# Patient Record
Sex: Male | Born: 1975 | Race: Black or African American | Hispanic: No | Marital: Married | Smoking: Never smoker
Health system: Southern US, Community
[De-identification: ages and names within clinical notes are randomized; demographics above are authoritative.]

## PROBLEM LIST (undated history)

## (undated) DIAGNOSIS — K219 Gastro-esophageal reflux disease without esophagitis: Secondary | ICD-10-CM

## (undated) DIAGNOSIS — D649 Anemia, unspecified: Secondary | ICD-10-CM

## (undated) DIAGNOSIS — E119 Type 2 diabetes mellitus without complications: Secondary | ICD-10-CM

## (undated) DIAGNOSIS — E785 Hyperlipidemia, unspecified: Secondary | ICD-10-CM

## (undated) HISTORY — DX: Anemia, unspecified: D64.9

## (undated) HISTORY — PX: VASECTOMY: SHX75

## (undated) HISTORY — PX: COLONOSCOPY: SHX174

## (undated) HISTORY — DX: Type 2 diabetes mellitus without complications: E11.9

## (undated) HISTORY — DX: Gastro-esophageal reflux disease without esophagitis: K21.9

## (undated) HISTORY — DX: Hyperlipidemia, unspecified: E78.5

---

## 1999-07-31 ENCOUNTER — Emergency Department (HOSPITAL_COMMUNITY): Admission: EM | Admit: 1999-07-31 | Discharge: 1999-07-31 | Payer: Self-pay | Admitting: Emergency Medicine

## 2003-09-14 ENCOUNTER — Emergency Department (HOSPITAL_COMMUNITY): Admission: EM | Admit: 2003-09-14 | Discharge: 2003-09-14 | Payer: Self-pay | Admitting: Emergency Medicine

## 2003-09-14 ENCOUNTER — Other Ambulatory Visit: Payer: Self-pay

## 2006-05-11 ENCOUNTER — Emergency Department (HOSPITAL_COMMUNITY): Admission: EM | Admit: 2006-05-11 | Discharge: 2006-05-11 | Payer: Self-pay | Admitting: Emergency Medicine

## 2006-08-21 ENCOUNTER — Ambulatory Visit (HOSPITAL_COMMUNITY): Admission: RE | Admit: 2006-08-21 | Discharge: 2006-08-21 | Payer: Self-pay | Admitting: Internal Medicine

## 2008-07-12 ENCOUNTER — Ambulatory Visit: Payer: Self-pay | Admitting: Diagnostic Radiology

## 2008-07-12 ENCOUNTER — Ambulatory Visit (HOSPITAL_BASED_OUTPATIENT_CLINIC_OR_DEPARTMENT_OTHER): Admission: RE | Admit: 2008-07-12 | Discharge: 2008-07-12 | Payer: Self-pay | Admitting: Internal Medicine

## 2008-07-21 ENCOUNTER — Encounter: Admission: RE | Admit: 2008-07-21 | Discharge: 2008-10-02 | Payer: Self-pay | Admitting: Internal Medicine

## 2008-11-23 ENCOUNTER — Emergency Department (HOSPITAL_BASED_OUTPATIENT_CLINIC_OR_DEPARTMENT_OTHER): Admission: EM | Admit: 2008-11-23 | Discharge: 2008-11-23 | Payer: Self-pay | Admitting: Emergency Medicine

## 2010-05-10 ENCOUNTER — Emergency Department (INDEPENDENT_AMBULATORY_CARE_PROVIDER_SITE_OTHER)
Admission: EM | Admit: 2010-05-10 | Discharge: 2010-05-11 | Payer: Self-pay | Source: Home / Self Care | Admitting: Emergency Medicine

## 2010-05-10 LAB — POCT CARDIAC MARKERS
CKMB, poc: <1 ng/mL — ABNORMAL LOW (ref 1.0–8.0)
Troponin i, poc: <0.05 ng/mL (ref 0.00–0.09)

## 2010-05-10 LAB — DIFFERENTIAL
Basophils Relative: 1 % (ref 0–1)
Eosinophils Relative: 5 % (ref 0–5)
Lymphocytes Relative: 50 % — ABNORMAL HIGH (ref 12–46)
Monocytes Absolute: 0.6 10*3/uL (ref 0.1–1.0)
Monocytes Relative: 10 % (ref 3–12)
Neutro Abs: 1.9 10*3/uL (ref 1.7–7.7)

## 2010-05-10 LAB — CBC
MCHC: 33.6 g/dL (ref 30.0–36.0)
MCV: 67.8 fL — ABNORMAL LOW (ref 78.0–100.0)
Platelets: 196 10*3/uL (ref 150–400)
RBC: 5.66 MIL/uL (ref 4.22–5.81)

## 2010-05-11 DIAGNOSIS — R079 Chest pain, unspecified: Secondary | ICD-10-CM

## 2010-05-11 DIAGNOSIS — R0602 Shortness of breath: Secondary | ICD-10-CM

## 2010-05-11 LAB — POCT CARDIAC MARKERS
CKMB, poc: <1 ng/mL — ABNORMAL LOW (ref 1.0–8.0)
Myoglobin, poc: 29.2 ng/mL (ref 12–200)
Troponin i, poc: <0.05 ng/mL (ref 0.00–0.09)

## 2010-05-11 LAB — BASIC METABOLIC PANEL
CO2: 27 mEq/L (ref 19–32)
Calcium: 9.4 mg/dL (ref 8.4–10.5)
Chloride: 106 mEq/L (ref 96–112)
GFR calc Af Amer: >60 mL/min (ref >60)

## 2010-05-11 LAB — D-DIMER, QUANTITATIVE: D-Dimer, Quant: <0.22 ug/mL-FEU (ref 0.00–0.48)

## 2010-07-20 LAB — COMPREHENSIVE METABOLIC PANEL
AST: 28 U/L (ref 0–37)
Alkaline Phosphatase: 46 U/L (ref 39–117)
BUN: 17 mg/dL (ref 6–23)
CO2: 28 mEq/L (ref 19–32)
Calcium: 9.2 mg/dL (ref 8.4–10.5)
Creatinine, Ser: 1.1 mg/dL (ref 0.4–1.5)
Glucose, Bld: 91 mg/dL (ref 70–99)
Total Protein: 7.7 g/dL (ref 6.0–8.3)

## 2010-07-20 LAB — DIFFERENTIAL
Basophils Absolute: 0.2 10*3/uL — ABNORMAL HIGH (ref 0.0–0.1)
Monocytes Absolute: 0.7 10*3/uL (ref 0.1–1.0)
Monocytes Relative: 9 % (ref 3–12)
Neutro Abs: 5.9 10*3/uL (ref 1.7–7.7)

## 2010-07-20 LAB — CBC
HCT: 39.8 % (ref 39.0–52.0)
Hemoglobin: 13.1 g/dL (ref 13.0–17.0)
MCHC: 32.8 g/dL (ref 30.0–36.0)
MCV: 74.8 fL — ABNORMAL LOW (ref 78.0–100.0)
RBC: 5.33 MIL/uL (ref 4.22–5.81)
RDW: 13.3 % (ref 11.5–15.5)
WBC: 8.1 10*3/uL (ref 4.0–10.5)

## 2010-07-20 LAB — URINALYSIS, ROUTINE W REFLEX MICROSCOPIC
Bilirubin Urine: NEGATIVE
Specific Gravity, Urine: 1.004 — ABNORMAL LOW (ref 1.005–1.030)
Urobilinogen, UA: 0.2 mg/dL (ref 0.0–1.0)

## 2010-07-20 LAB — URINE MICROSCOPIC-ADD ON

## 2010-07-20 LAB — URINE CULTURE

## 2011-10-11 ENCOUNTER — Encounter (HOSPITAL_BASED_OUTPATIENT_CLINIC_OR_DEPARTMENT_OTHER): Payer: Self-pay | Admitting: *Deleted

## 2011-10-11 ENCOUNTER — Emergency Department (HOSPITAL_BASED_OUTPATIENT_CLINIC_OR_DEPARTMENT_OTHER)
Admission: EM | Admit: 2011-10-11 | Discharge: 2011-10-12 | Disposition: A | Discharge status: Home / Self Care | Payer: Self-pay | Attending: Emergency Medicine | Admitting: Emergency Medicine

## 2011-10-11 DIAGNOSIS — K6289 Other specified diseases of anus and rectum: Secondary | ICD-10-CM | POA: Insufficient documentation

## 2011-10-11 DIAGNOSIS — K649 Unspecified hemorrhoids: Secondary | ICD-10-CM | POA: Insufficient documentation

## 2011-10-11 MED ORDER — HYDROCORTISONE 2.5 % RE CREA: TOPICAL_CREAM | RECTAL | Status: AC

## 2011-10-11 NOTE — ED Notes (Signed)
Pt states he has had some problems with rectal pain and bleeding. Wife "checked" and states he has "something hanging out". Denies itching.

## 2011-10-11 NOTE — ED Provider Notes (Signed)
History/physical exam/procedure(s) were performed by non-physician practitioner and as supervising physician I was immediately available for consultation/collaboration. I have reviewed all notes and am in agreement with care and plan.   Zeva Leber S Quinten Allerton, MD 10/11/11 2348 

## 2011-10-11 NOTE — Discharge Instructions (Signed)
High Fiber Diet A high fiber diet changes your normal diet to include more whole grains, legumes, fruits, and vegetables. Changes in the diet involve replacing refined carbohydrates with unrefined foods. The calorie level of the diet is essentially unchanged. The Dietary Reference Intake (recommended amount) for adult males is 38 g per day. For adult females, it is 25 g per day. Pregnant and lactating women should consume 28 g of fiber per day. Fiber is the intact part of a plant that is not broken down during digestion. Functional fiber is fiber that has been isolated from the plant to provide a beneficial effect in the body. PURPOSE  Increase stool bulk.   Ease and regulate bowel movements.   Lower cholesterol.  INDICATIONS THAT YOU NEED MORE FIBER  Constipation and hemorrhoids.   Uncomplicated diverticulosis (intestine condition) and irritable bowel syndrome.   Weight management.   As a protective measure against hardening of the arteries (atherosclerosis), diabetes, and cancer.  NOTE OF CAUTION If you have a digestive or bowel problem, ask your caregiver for advice before adding high fiber foods to your diet. Some of the following medical problems are such that a high fiber diet should not be used without consulting your caregiver:  Acute diverticulitis (intestine infection).   Partial small bowel obstructions.   Complicated diverticular disease involving bleeding, rupture (perforation), or abscess (boil, furuncle).   Presence of autonomic neuropathy (nerve damage) or gastric paresis (stomach cannot empty itself).  GUIDELINES FOR INCREASING FIBER  Start adding fiber to the diet slowly. A gradual increase of about 5 more grams (2 slices of whole-wheat bread, 2 servings of most fruits or vegetables, or 1 bowl of high fiber cereal) per day is best. Too rapid an increase in fiber may result in constipation, flatulence, and bloating.   Drink enough water and fluids to keep your urine  clear or pale yellow. Water, juice, or caffeine-free drinks are recommended. Not drinking enough fluid may cause constipation.   Eat a variety of high fiber foods rather than one type of fiber.   Try to increase your intake of fiber through using high fiber foods rather than fiber pills or supplements that contain small amounts of fiber.   The goal is to change the types of food eaten. Do not supplement your present diet with high fiber foods, but replace foods in your present diet.  INCLUDE A VARIETY OF FIBER SOURCES  Replace refined and processed grains with whole grains, canned fruits with fresh fruits, and incorporate other fiber sources. White rice, white breads, and most bakery goods contain little or no fiber.   Brown whole-grain rice, buckwheat oats, and many fruits and vegetables are all good sources of fiber. These include: broccoli, Brussels sprouts, cabbage, cauliflower, beets, sweet potatoes, white potatoes (skin on), carrots, tomatoes, eggplant, squash, berries, fresh fruits, and dried fruits.   Cereals appear to be the richest source of fiber. Cereal fiber is found in whole grains and bran. Bran is the fiber-rich outer coat of cereal grain, which is largely removed in refining. In whole-grain cereals, the bran remains. In breakfast cereals, the largest amount of fiber is found in those with "bran" in their names. The fiber content is sometimes indicated on the label.   You may need to include additional fruits and vegetables each day.   In baking, for 1 cup white flour, you may use the following substitutions:   1 cup whole-wheat flour minus 2 tbs.    cup white flour plus    cup whole-wheat flour.  Document Released: 03/31/2005 Document Revised: 03/20/2011 Document Reviewed: 02/06/2009 Exit[employer] Patient Information 2012 [employer].   Hemorrhoids Hemorrhoids are enlarged (dilated) veins around the rectum. There are 2 types of hemorrhoids, and the type of hemorrhoid is  determined by its location. Internal hemorrhoids occur in the veins just inside the rectum.They are usually not painful, but they may bleed.However, they may poke through to the outside and become irritated and painful. External hemorrhoids involve the veins outside the anus and can be felt as a painful swelling or hard lump near the anus.They are often itchy and may crack and bleed. Sometimes clots will form in the veins. This makes them swollen and painful. These are called thrombosed hemorrhoids. CAUSES Causes of hemorrhoids include:  Pregnancy. This increases the pressure in the hemorrhoidal veins.   Constipation.   Straining to have a bowel movement.   Obesity.   Heavy lifting or other activity that caused you to strain.  TREATMENT Most of the time hemorrhoids improve in 1 to 2 weeks. However, if symptoms do not seem to be getting better or if you have a lot of rectal bleeding, your caregiver may perform a procedure to help make the hemorrhoids get smaller or remove them completely.Possible treatments include:  Rubber band ligation. A rubber band is placed at the base of the hemorrhoid to cut off the circulation.   Sclerotherapy. A chemical is injected to shrink the hemorrhoid.   Infrared light therapy. Tools are used to burn the hemorrhoid.   Hemorrhoidectomy. This is surgical removal of the hemorrhoid.  HOME CARE INSTRUCTIONS   Increase fiber in your diet. Ask your caregiver about using fiber supplements.   Drink enough water and fluids to keep your urine clear or pale yellow.   Exercise regularly.   Go to the bathroom when you have the urge to have a bowel movement. Do not wait.   Avoid straining to have bowel movements.   Keep the anal area dry and clean.   Only take over-the-counter or prescription medicines for pain, discomfort, or fever as directed by your caregiver.  If your hemorrhoids are thrombosed:  Take warm sitz baths for 20 to 30 minutes, 3 to 4 times  per day.   If the hemorrhoids are very tender and swollen, place ice packs on the area as tolerated. Using ice packs between sitz baths may be helpful. Fill a plastic bag with ice. Place a towel between the bag of ice and your skin.   Medicated creams and suppositories may be used or applied as directed.   Do not use a donut-shaped pillow or sit on the toilet for long periods. This increases blood pooling and pain.  SEEK MEDICAL CARE IF:   You have increasing pain and swelling that is not controlled with your medicine.   You have uncontrolled bleeding.   You have difficulty or you are unable to have a bowel movement.   You have pain or inflammation outside the area of the hemorrhoids.   You have chills or an oral temperature above 102 F (38.9 C).  MAKE SURE YOU:   Understand these instructions.   Will watch your condition.   Will get help right away if you are not doing well or get worse.  Document Released: 03/28/2000 Document Revised: 03/20/2011 Document Reviewed: 08/03/2007 ExitCare Patient Information 2012 [employer].  

## 2011-10-11 NOTE — ED Provider Notes (Signed)
History     CSN: [id number]  Arrival date & time 10/11/11  2149   First MD Initiated Contact with Patient 10/11/11 2246      Chief Complaint  Patient presents with  . Rectal Pain    (Consider location/radiation/quality/duration/timing/severity/associated sxs/prior treatment) HPI Comments: Pt states that he has had rectal pain and bleeding with wiping over the last couple of days and his wife noted this evening that he had something sticking out:pt states that he has history of constipation  The history is provided by the patient. No language interpreter was used.    History reviewed. No pertinent past medical history.  History reviewed. No pertinent past surgical history.  History reviewed. No pertinent family history.  History  Substance Use Topics  . Smoking status: Never Smoker   . Smokeless tobacco: Not on file  . Alcohol Use: No      Review of Systems  Constitutional: Negative.   Respiratory: Negative.   Cardiovascular: Negative.   Neurological: Negative.     Allergies  Review of patient's allergies indicates no known allergies.  Home Medications  No current outpatient prescriptions on file.  BP 131/75  Pulse 103  Temp 98.7 F (37.1 C) (Oral)  Resp 20  Ht 6' (1.829 m)  Wt 219 lb (99.338 kg)  BMI 29.70 kg/m2  SpO2 100%  Physical Exam  Vitals reviewed. Constitutional: He is oriented to person, place, and time. He appears well-developed and well-nourished.  Cardiovascular: Normal rate and regular rhythm.   Pulmonary/Chest: Effort normal.  Genitourinary:       External hemorrhoids noted  Neurological: He is alert and oriented to person, place, and time.  Skin: Skin is warm and dry.    ED Course  Procedures (including critical care time)  Labs Reviewed - No data to display No results found.   1. Hemorrhoid       MDM  Educated pt on high fiber foods        Teressa Lower, NP 10/11/11 2340

## 2012-12-03 ENCOUNTER — Encounter (HOSPITAL_BASED_OUTPATIENT_CLINIC_OR_DEPARTMENT_OTHER): Payer: Self-pay

## 2012-12-03 ENCOUNTER — Emergency Department (HOSPITAL_BASED_OUTPATIENT_CLINIC_OR_DEPARTMENT_OTHER)
Admission: EM | Admit: 2012-12-03 | Discharge: 2012-12-03 | Disposition: A | Discharge status: Home / Self Care | Payer: BC Managed Care – PPO | Attending: Emergency Medicine | Admitting: Emergency Medicine

## 2012-12-03 DIAGNOSIS — K625 Hemorrhage of anus and rectum: Secondary | ICD-10-CM | POA: Insufficient documentation

## 2012-12-03 DIAGNOSIS — R142 Eructation: Secondary | ICD-10-CM | POA: Insufficient documentation

## 2012-12-03 DIAGNOSIS — R5381 Other malaise: Secondary | ICD-10-CM | POA: Insufficient documentation

## 2012-12-03 DIAGNOSIS — R209 Unspecified disturbances of skin sensation: Secondary | ICD-10-CM | POA: Insufficient documentation

## 2012-12-03 DIAGNOSIS — D649 Anemia, unspecified: Secondary | ICD-10-CM | POA: Insufficient documentation

## 2012-12-03 DIAGNOSIS — R141 Gas pain: Secondary | ICD-10-CM | POA: Insufficient documentation

## 2012-12-03 DIAGNOSIS — K5900 Constipation, unspecified: Secondary | ICD-10-CM | POA: Insufficient documentation

## 2012-12-03 DIAGNOSIS — K922 Gastrointestinal hemorrhage, unspecified: Secondary | ICD-10-CM

## 2012-12-03 DIAGNOSIS — K921 Melena: Secondary | ICD-10-CM | POA: Insufficient documentation

## 2012-12-03 LAB — CBC WITH DIFFERENTIAL/PLATELET
Basophils Absolute: 0 10*3/uL (ref 0.0–0.1)
Basophils Relative: 0 % (ref 0–1)
Eosinophils Absolute: 0.2 10*3/uL (ref 0.0–0.7)
Eosinophils Relative: 3 % (ref 0–5)
HCT: 35.8 % — ABNORMAL LOW (ref 39.0–52.0)
Hemoglobin: 11.5 g/dL — ABNORMAL LOW (ref 13.0–17.0)
Lymphs Abs: 2.3 10*3/uL (ref 0.7–4.0)
MCV: 66.5 fL — ABNORMAL LOW (ref 78.0–100.0)
Monocytes Absolute: 0.7 10*3/uL (ref 0.1–1.0)
Neutro Abs: 1.8 10*3/uL (ref 1.7–7.7)
Neutrophils Relative %: 36 % — ABNORMAL LOW (ref 43–77)
Platelets: 217 10*3/uL (ref 150–400)
WBC: 5 10*3/uL (ref 4.0–10.5)

## 2012-12-03 LAB — COMPREHENSIVE METABOLIC PANEL
AST: 17 U/L (ref 0–37)
Alkaline Phosphatase: 45 U/L (ref 39–117)
BUN: 12 mg/dL (ref 6–23)
CO2: 26 mEq/L (ref 19–32)
Chloride: 103 mEq/L (ref 96–112)
GFR calc Af Amer: >90 mL/min (ref >90)
Sodium: 140 mEq/L (ref 135–145)
Total Protein: 7.7 g/dL (ref 6.0–8.3)

## 2012-12-03 LAB — OCCULT BLOOD X 1 CARD TO LAB, STOOL: Fecal Occult Bld: POSITIVE — AB

## 2012-12-03 NOTE — ED Provider Notes (Signed)
CSN: [id number]     Arrival date & time 12/03/12  1429 History     First MD Initiated Contact with Patient 12/03/12 1500     Chief Complaint  Patient presents with  . Dizziness    HPI  Patient presents with concern of dizziness and bilateral dysesthesia in all extremities.  These developments are new.  Patient has a longer history of fatigue, intermittent GI bleeding.  In the past weeks he has been evaluated by his primary care physician and the GI doctor to 2 abnormal iron function testing and new anemia. He states over the past 2/3 days he has had the aforementioned new complaints as well as persistent fatigue. He denies a specific pain anywhere, states that he feels generally unwell. He now complains of constipation as well as intermittent bloody stool. He is scheduled for endoscopy in 2 weeks. Patient is otherwise generally well, denied other medical problems, smoking, alcohol use. His family history of diabetes mellitus  History reviewed. No pertinent past medical history. History reviewed. No pertinent past surgical history. No family history on file. History  Substance Use Topics  . Smoking status: Never Smoker   . Smokeless tobacco: Not on file  . Alcohol Use: No    Review of Systems  Constitutional:       Per HPI, otherwise negative  HENT:       Per HPI, otherwise negative  Respiratory:       Per HPI, otherwise negative  Cardiovascular:       Per HPI, otherwise negative  Gastrointestinal: Positive for constipation, blood in stool, abdominal distention and anal bleeding. Negative for vomiting, abdominal pain, diarrhea and rectal pain.  Endocrine:       Negative aside from HPI  Genitourinary:       Neg aside from HPI   Musculoskeletal:       Per HPI, otherwise negative  Skin: Negative.   Neurological: Positive for dizziness, weakness, light-headedness and numbness. Negative for tremors, seizures, syncope, facial asymmetry, speech difficulty and headaches.     Allergies  Review of patient's allergies indicates no known allergies.  Home Medications  No current outpatient prescriptions on file. BP 131/86  Pulse 73  Temp(Src) 98.7 F (37.1 C) (Oral)  Resp 16  Ht 6\' 2"  (1.88 m)  Wt 227 lb (102.967 kg)  BMI 29.13 kg/m2  SpO2 100% Physical Exam  Nursing note and vitals reviewed. Constitutional: He is oriented to person, place, and time. He appears well-developed. No distress.  HENT:  Head: Normocephalic and atraumatic.  Eyes: Conjunctivae and EOM are normal.  Cardiovascular: Normal rate and regular rhythm.   Pulmonary/Chest: Effort normal. No stridor. No respiratory distress.  Abdominal: He exhibits no distension.  Genitourinary: Rectal exam shows external hemorrhoid. Rectal exam shows no internal hemorrhoid, no fissure, no mass, no tenderness and anal tone normal. Guaiac positive stool.  No bleeding external hemorrhoids, no fissure, stool is grossly brown  Musculoskeletal: He exhibits no edema.  Neurological: He is alert and oriented to person, place, and time.  Skin: Skin is warm and dry.  Psychiatric: He has a normal mood and affect.    ED Course   Procedures (including critical care time)  Labs Reviewed  CBC WITH DIFFERENTIAL  COMPREHENSIVE METABOLIC PANEL  OCCULT BLOOD X 1 CARD TO LAB, STOOL   No results found. No diagnosis found.   4:54 PM Patient in no distress.  Discussed all findings.  We discussed the need for prompt outpatient followup. MDM  This  patient presents with concern for dysesthesia, dizziness.  Patient is afebrile, not tachycardic, tachypneic, in no distress.  Patient's stool Hemoccult positive, but grossly negative.  His hemoglobin is decreased from prior, but not critically low.  Absent distress, and with the relatively reassuring findings, the patient was discharged in stable condition to follow up with his GI team promptly.  Gerhard Munch, MD 12/03/12 1655

## 2012-12-03 NOTE — ED Notes (Signed)
Pt c/o dizzines and numbness to hands and feer x 2 weeks-was seen by PCP last week-advised iron level was low-was seen by GI this week-iron level normal but ferritin level low-is scheduled for GI procedure 9/12 "to see where i'm bleeding"-reports dark stools x approx 2 months

## 2012-12-23 ENCOUNTER — Emergency Department (HOSPITAL_BASED_OUTPATIENT_CLINIC_OR_DEPARTMENT_OTHER)
Admission: EM | Admit: 2012-12-23 | Discharge: 2012-12-23 | Disposition: A | Discharge status: Home / Self Care | Payer: BC Managed Care – PPO | Attending: Emergency Medicine | Admitting: Emergency Medicine

## 2012-12-23 ENCOUNTER — Emergency Department (HOSPITAL_BASED_OUTPATIENT_CLINIC_OR_DEPARTMENT_OTHER): Payer: BC Managed Care – PPO

## 2012-12-23 ENCOUNTER — Encounter (HOSPITAL_BASED_OUTPATIENT_CLINIC_OR_DEPARTMENT_OTHER): Payer: Self-pay | Admitting: *Deleted

## 2012-12-23 DIAGNOSIS — R109 Unspecified abdominal pain: Secondary | ICD-10-CM

## 2012-12-23 DIAGNOSIS — R1031 Right lower quadrant pain: Secondary | ICD-10-CM | POA: Insufficient documentation

## 2012-12-23 DIAGNOSIS — K5900 Constipation, unspecified: Secondary | ICD-10-CM | POA: Insufficient documentation

## 2012-12-23 DIAGNOSIS — R110 Nausea: Secondary | ICD-10-CM | POA: Insufficient documentation

## 2012-12-23 DIAGNOSIS — Z79899 Other long term (current) drug therapy: Secondary | ICD-10-CM | POA: Insufficient documentation

## 2012-12-23 LAB — CBC WITH DIFFERENTIAL/PLATELET
Basophils Absolute: 0 10*3/uL (ref 0.0–0.1)
Eosinophils Absolute: 0.1 10*3/uL (ref 0.0–0.7)
HCT: 36.7 % — ABNORMAL LOW (ref 39.0–52.0)
Lymphocytes Relative: 18 % (ref 12–46)
MCHC: 32.7 g/dL (ref 30.0–36.0)
Monocytes Relative: 8 % (ref 3–12)
Neutrophils Relative %: 73 % (ref 43–77)
Platelets: 176 10*3/uL (ref 150–400)
RDW: 20.6 % — ABNORMAL HIGH (ref 11.5–15.5)
WBC: 8.4 10*3/uL (ref 4.0–10.5)

## 2012-12-23 LAB — COMPREHENSIVE METABOLIC PANEL
AST: 15 U/L (ref 0–37)
BUN: 7 mg/dL (ref 6–23)
CO2: 24 mEq/L (ref 19–32)
Chloride: 101 mEq/L (ref 96–112)
Creatinine, Ser: 1 mg/dL (ref 0.50–1.35)
GFR calc non Af Amer: >90 mL/min (ref >90)
Total Bilirubin: 0.9 mg/dL (ref 0.3–1.2)

## 2012-12-23 LAB — URINALYSIS, ROUTINE W REFLEX MICROSCOPIC
Glucose, UA: NEGATIVE mg/dL
Ketones, ur: NEGATIVE mg/dL
Leukocytes, UA: NEGATIVE
Nitrite: NEGATIVE
Protein, ur: NEGATIVE mg/dL
Urobilinogen, UA: 0.2 mg/dL (ref 0.0–1.0)

## 2012-12-23 MED ORDER — HYDROCODONE-ACETAMINOPHEN 5-325 MG PO TABS: 2.0000 | ORAL_TABLET | ORAL | Status: DC | PRN

## 2012-12-23 MED ORDER — IOHEXOL 300 MG/ML  SOLN
50.0000 mL | Freq: Once | INTRAMUSCULAR | Status: AC | PRN
  Administered 2012-12-23: 50 mL via ORAL

## 2012-12-23 MED ORDER — IOHEXOL 300 MG/ML  SOLN
100.0000 mL | Freq: Once | INTRAMUSCULAR | Status: AC | PRN
  Administered 2012-12-23: 100 mL via INTRAVENOUS

## 2012-12-23 NOTE — ED Provider Notes (Signed)
CSN: [id number]     Arrival date & time 12/23/12  1902 History   First MD Initiated Contact with Patient 12/23/12 1925     Chief Complaint  Patient presents with  . Abdominal Pain   (Consider location/radiation/quality/duration/timing/severity/associated sxs/prior Treatment) HPI Comments: Pt states that he has long term problem with constipation and rectal bleeding:pt states that he woke up this morning with diffuse abdominal so he used magnesium citrate and had a bowel movement:pt states that the pain has moved form the diffuse to rlq:pt states that he does have some chronic problems with pain ,but this pain is different then normal:nausea, no vomiting:denies rectal bleeding:states that he has had chills today  The history is provided by the patient. No language interpreter was used.    History reviewed. No pertinent past medical history. History reviewed. No pertinent past surgical history. No family history on file. History  Substance Use Topics  . Smoking status: Never Smoker   . Smokeless tobacco: Not on file  . Alcohol Use: No    Review of Systems  Constitutional: Negative.   Respiratory: Negative.   Cardiovascular: Negative.     Allergies  Review of patient's allergies indicates no known allergies.  Home Medications   Current Outpatient Rx  Name  Route  Sig  Dispense  Refill  . IRON PO   Oral   Take by mouth.         . metFORMIN (GLUCOPHAGE) 500 MG tablet   Oral   Take 500 mg by mouth 2 (two) times daily with a meal.          BP 114/82  Pulse 93  Temp(Src) 99.1 F (37.3 C) (Oral)  Resp 20  Ht 6\' 2"  (1.88 m)  Wt 220 lb (99.791 kg)  BMI 28.23 kg/m2  SpO2 98% Physical Exam  Nursing note and vitals reviewed. Constitutional: He is oriented to person, place, and time. He appears well-developed and well-nourished.  HENT:  Head: Normocephalic and atraumatic.  Eyes: Conjunctivae and EOM are normal.  Neck: Neck supple.  Cardiovascular: Normal rate and  regular rhythm.   Pulmonary/Chest: Effort normal and breath sounds normal.  Abdominal: Soft. Bowel sounds are normal. There is tenderness in the right lower quadrant.  Musculoskeletal: Normal range of motion.  Neurological: He is alert and oriented to person, place, and time.  Skin: Skin is warm and dry.    ED Course  Procedures (including critical care time) Labs Review Labs Reviewed  URINALYSIS, ROUTINE W REFLEX MICROSCOPIC - Abnormal; Notable for the following:    Specific Gravity, Urine 1.002 (*)    All other components within normal limits  CBC WITH DIFFERENTIAL - Abnormal; Notable for the following:    Hemoglobin 12.0 (*)    HCT 36.7 (*)    MCV 68.2 (*)    MCH 22.3 (*)    RDW 20.6 (*)    All other components within normal limits  COMPREHENSIVE METABOLIC PANEL - Abnormal; Notable for the following:    Glucose, Bld 106 (*)    All other components within normal limits   Imaging Review Ct Abdomen Pelvis W Contrast  12/23/2012   *RADIOLOGY REPORT*  Clinical Data: Abdominal pain  CT ABDOMEN AND PELVIS WITH CONTRAST  Technique:  Multidetector CT imaging of the abdomen and pelvis was performed following the standard protocol during bolus administration of intravenous contrast.  Contrast:  OMNIPAQUE IOHEXOL 300 MG/ML  SOLN  Comparison: 08/21/2006  Findings: The lung bases are free of acute infiltrate  or sizable effusion.  The liver, gallbladder, spleen, adrenal glands and pancreas are all normal in their CT appearance.  Kidneys are well visualized bilaterally demonstrating normal enhancement pattern.  The appendix is within normal limits without inflammatory change.  The bladder is partially distended with urine.  No pelvic mass lesion or side wall abnormality is noted. The bony structures are within normal limits.  IMPRESSION: No acute abnormalities seen.   Original Report Authenticated By: Alcide Clever, M.D.    MDM   1. Abdominal pain   no acute process noted on ct;will treat  symptomatically:pt to follow up with colonoscopy in 6 weeks    Teressa Lower, NP 12/23/12 2140

## 2012-12-23 NOTE — ED Provider Notes (Signed)
Medical screening examination/treatment/procedure(s) were performed by non-physician practitioner and as supervising physician I was immediately available for consultation/collaboration.   Carlena Ruybal, MD 12/23/12 2235 

## 2012-12-23 NOTE — ED Notes (Signed)
Abdominal pain. Has been constipated. Had a small bowel movement and pain went from the left lower quadrant to the right lower quadrant. Colonoscopy was scheduled for tomorrow but he did not have the money for the co pay. He states his iron level dropped from 50 to 6.

## 2013-03-15 ENCOUNTER — Encounter: Payer: Self-pay | Admitting: *Deleted

## 2013-03-15 ENCOUNTER — Encounter: Payer: BC Managed Care – PPO | Attending: Internal Medicine | Admitting: *Deleted

## 2013-03-15 VITALS — Ht 72.0 in | Wt 210.0 lb

## 2013-03-15 DIAGNOSIS — E119 Type 2 diabetes mellitus without complications: Secondary | ICD-10-CM

## 2013-03-15 DIAGNOSIS — E109 Type 1 diabetes mellitus without complications: Secondary | ICD-10-CM | POA: Insufficient documentation

## 2013-03-15 DIAGNOSIS — Z713 Dietary counseling and surveillance: Secondary | ICD-10-CM | POA: Insufficient documentation

## 2013-03-15 NOTE — Progress Notes (Signed)
Appt start time: 1200 end time:  1330.  Assessment:  Patient was seen on  03/15/13 for individual diabetes education. Patient has recently been advised that he has new onset T2DM. This appointment was advised by Dr. Greggory Stallion Osei-Bonsu. Patient last saw him 12/06/12. The patient has recently changed to an [facility] physician for 03/21/13. 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 Lives with wife and two children and a nephew. Wife does the shopping and cooking. Patient is from [place].  Mother has passed, father still lives in [place]. Initially had some neuropathy in hands. Was given Lyrica, took 3 doses and stopped. Symptoms resolved. Has had 18# weight loss since 11/2012. He has not been directed to test glucose.  Current HbA1c: 6.6%  Preferred Learning Style:   Hands on  Learning Readiness:   Change in progress  MEDICATIONS: Was initially RX Metformin. Took for 30 days and did not realize there was a refill. Is not presently taking medication for T2DM  DIETARY INTAKE:  B ( AM): egg, wheat bread x2, water  Snk ( AM): none  L ( PM): brown rice 1 1/2C  / or white rice 2/3C  / vegetables, baked fish or chicken , water Snk ( PM): none D ( PM): rice, vegetables Snk ( PM): none Beverages: water  At present he is doing a fasting & prayer from 6A-6P. Will then eat regular food after 6pm.  Usual physical activity: Goes to the gym 3X weekly for each visit.  Also coaches soccer.  Intervention:  Nutrition counseling provided.  Discussed diabetes disease process and treatment options.  Discussed physiology of diabetes and role of obesity on insulin resistance.  Encouraged moderate weight reduction to improve glucose levels.  Discussed role of medications and diet in glucose control  Provided education on macronutrients on glucose levels.  Provided education on carb counting, importance of regularly scheduled meals/snacks,  and meal planning  Discussed effects of physical activity on glucose levels and long-term glucose control.  Recommended 150 minutes of physical activity/week.  Reviewed patient medications.  Discussed role of medication on blood glucose and possible side effects  Discussed blood glucose monitoring and interpretation.  Discussed recommended target ranges and individual ranges.    Described short-term complications: hyper- and hypo-glycemia.  Discussed causes,symptoms, and treatment options.  Discussed prevention, detection, and treatment of long-term complications.  Discussed the role of prolonged elevated glucose levels on body systems.  Discussed role of stress on blood glucose levels and discussed strategies to manage psychosocial issues.  Discussed recommendations for long-term diabetes self-care.  Established checklist for medical, dental, and emotional self-care.  Teaching Method Utilized:  Visual Auditory Hands on  Handouts given during visit include: Living Well with Diabetes Carb Counting  Meal Plan Card Plate Method  Barriers to learning/adherence to lifestyle change: Culture r/t dietary intake  Diabetes self-care support plan:   [facility] support group  family  Demonstrated degree of understanding via:  Teach Back   Monitoring/Evaluation:  Dietary intake, exercise, and body weight follow up prn.

## 2014-06-27 ENCOUNTER — Emergency Department (HOSPITAL_BASED_OUTPATIENT_CLINIC_OR_DEPARTMENT_OTHER)
Admission: EM | Admit: 2014-06-27 | Discharge: 2014-06-28 | Disposition: A | Discharge status: Home / Self Care | Payer: BLUE CROSS/BLUE SHIELD | Attending: Emergency Medicine | Admitting: Emergency Medicine

## 2014-06-27 ENCOUNTER — Encounter (HOSPITAL_BASED_OUTPATIENT_CLINIC_OR_DEPARTMENT_OTHER): Payer: Self-pay | Admitting: *Deleted

## 2014-06-27 DIAGNOSIS — E119 Type 2 diabetes mellitus without complications: Secondary | ICD-10-CM | POA: Diagnosis not present

## 2014-06-27 DIAGNOSIS — K529 Noninfective gastroenteritis and colitis, unspecified: Secondary | ICD-10-CM | POA: Insufficient documentation

## 2014-06-27 DIAGNOSIS — Z79899 Other long term (current) drug therapy: Secondary | ICD-10-CM | POA: Diagnosis not present

## 2014-06-27 DIAGNOSIS — K648 Other hemorrhoids: Secondary | ICD-10-CM | POA: Diagnosis not present

## 2014-06-27 DIAGNOSIS — D649 Anemia, unspecified: Secondary | ICD-10-CM | POA: Diagnosis not present

## 2014-06-27 DIAGNOSIS — R1084 Generalized abdominal pain: Secondary | ICD-10-CM

## 2014-06-27 DIAGNOSIS — R109 Unspecified abdominal pain: Secondary | ICD-10-CM | POA: Diagnosis present

## 2014-06-27 LAB — URINE MICROSCOPIC-ADD ON

## 2014-06-27 LAB — URINALYSIS, ROUTINE W REFLEX MICROSCOPIC
GLUCOSE, UA: NEGATIVE mg/dL
HGB URINE DIPSTICK: NEGATIVE
Ketones, ur: 40 mg/dL — AB
Leukocytes, UA: NEGATIVE
Nitrite: NEGATIVE
PH: 5.5 (ref 5.0–8.0)
Protein, ur: 100 mg/dL — AB
Specific Gravity, Urine: 1.044 — ABNORMAL HIGH (ref 1.005–1.030)
Urobilinogen, UA: 0.2 mg/dL (ref 0.0–1.0)

## 2014-06-27 MED ORDER — SODIUM CHLORIDE 0.9 % IV SOLN
1000.0000 mL | Freq: Once | INTRAVENOUS | Status: AC
  Administered 2014-06-27: 1000 mL via INTRAVENOUS

## 2014-06-27 MED ORDER — ONDANSETRON HCL 4 MG/2ML IJ SOLN
4.0000 mg | Freq: Once | INTRAMUSCULAR | Status: AC
  Administered 2014-06-27: 4 mg via INTRAVENOUS
  Filled 2014-06-27: qty 2

## 2014-06-27 NOTE — ED Provider Notes (Signed)
CSN: [id number]     Arrival date & time 06/27/14  2157 History  This chart was scribed for Blane Ohara, MD by Gwenyth Ober, ED Scribe. This patient was seen in room MH01/MH01 and the patient's care was started at 11:37 PM.    Chief Complaint  Patient presents with  . Abdominal Pain   The history is provided by the patient. No language interpreter was used.    HPI Comments: Kurt Russell is a 39 y.o. male with a history of DM and hemorrhoids who presents to the Emergency Department complaining of constant, moderate abdominal pain that started 24 hours ago. He states mild dizziness, nausea, diarrhea, malodorous belching and increased flatulence as associated symptoms. Pt denies positive sick contact. He notes a history of similar symptoms, but states today's pain is worse than prior episodes. Pt denies recent travel, antibiotic treatment and EtOH/tobacco use. He also denies vomiting as an associated symptoms.  Past Medical History  Diagnosis Date  . Diabetes mellitus without complication   . Anemia   . Hyperlipidemia   . GERD (gastroesophageal reflux disease)    History reviewed. No pertinent past surgical history. No family history on file. History  Substance Use Topics  . Smoking status: Never Smoker   . Smokeless tobacco: Not on file  . Alcohol Use: No    Review of Systems  Gastrointestinal: Positive for nausea, abdominal pain and diarrhea. Negative for vomiting.  Neurological: Positive for dizziness.  All other systems reviewed and are negative.  Allergies  Review of patient's allergies indicates no known allergies.  Home Medications   Prior to Admission medications   Medication Sig Start Date End Date Taking? Authorizing Provider  HYDROcodone-acetaminophen (NORCO/VICODIN) 5-325 MG per tablet Take 2 tablets by mouth every 4 (four) hours as needed for pain. 12/23/12   Teressa Lower, NP  hydrocortisone (ANUSOL-HC) 2.5 % rectal cream Apply rectally 2 times daily 06/28/14    Blane Ohara, MD  IRON PO Take by mouth.    Historical Provider, MD  loperamide (IMODIUM) 2 MG capsule Take 1 capsule (2 mg total) by mouth 4 (four) times daily as needed for diarrhea or loose stools. 06/28/14   Blane Ohara, MD  metFORMIN (GLUCOPHAGE) 500 MG tablet Take 500 mg by mouth 2 (two) times daily with a meal.    Historical Provider, MD  ondansetron (ZOFRAN ODT) 4 MG disintegrating tablet  ODT q4 hours prn nausea/vomit 06/28/14   Blane Ohara, MD   BP 115/73 mmHg  Pulse 80  Temp(Src) 98.1 F (36.7 C)  Resp 16  Ht  (1.854 m)  Wt 210 lb (95.255 kg)  BMI 27.71 kg/m2  SpO2 100% Physical Exam  Constitutional: He appears well-developed and well-nourished. No distress.  HENT:  Head: Normocephalic and atraumatic.  Mildly dry mucous membranes  Eyes: Conjunctivae and EOM are normal. No scleral icterus.  Neck: Neck supple. No tracheal deviation present.  Cardiovascular: Normal rate.   Pulmonary/Chest: Effort normal. No respiratory distress.  Abdominal: There is tenderness.  Mild diffuse tenderness; no guarding; no peritonitis  Skin: Skin is warm and dry.  Psychiatric: He has a normal mood and affect. His behavior is normal.  Nursing note and vitals reviewed.   ED Course  Procedures   DIAGNOSTIC STUDIES: Oxygen Saturation is 99% on RA, normal by my interpretation.    COORDINATION OF CARE: 11:42 PM Discussed treatment plan with pt at bedside and pt agreed to plan.   Labs Review Labs Reviewed  URINALYSIS, ROUTINE W REFLEX MICROSCOPIC -  Abnormal; Notable for the following:    Color, Urine AMBER (*)    Specific Gravity, Urine 1.044 (*)    Bilirubin Urine SMALL (*)    Ketones, ur 40 (*)    Protein, ur 100 (*)    All other components within normal limits  CBC WITH DIFFERENTIAL/PLATELET - Abnormal; Notable for the following:    RBC 6.64 (*)    MCV 71.8 (*)    MCH 23.8 (*)    RDW 15.7 (*)    Neutrophils Relative % 82 (*)    Lymphocytes Relative 11 (*)    Lymphs  Abs 0.6 (*)    All other components within normal limits  COMPREHENSIVE METABOLIC PANEL - Abnormal; Notable for the following:    Glucose, Bld 117 (*)    Total Protein 9.2 (*)    Total Bilirubin 1.5 (*)    GFR calc non Af Amer 80 (*)    All other components within normal limits  URINE MICROSCOPIC-ADD ON  LIPASE, BLOOD    Imaging Review No results found.   EKG Interpretation None      MDM   Final diagnoses:  Abdominal pain, generalized  Enteritis  Other hemorrhoids    Results and differential diagnosis were discussed with the patient/parent/guardian. Close follow up outpatient was discussed, comfortable with the plan.   Patient improved in ER, medicines given for symptom treatment and known hemorrhoid treatment.  Medications  0.9 %  sodium chloride infusion (0 mLs Intravenous Stopped 06/28/14 0040)  ondansetron (ZOFRAN) injection 4 mg (4 mg Intravenous Given 06/27/14 2359)  loperamide (IMODIUM) capsule 4 mg (4 mg Oral Given 06/28/14 0133)  hydrocortisone (ANUSOL-HC) suppository 25 mg (25 mg Rectal Given 06/28/14 0228)  gi cocktail (Maalox,Lidocaine,Donnatal) (30 mLs Oral Given 06/28/14 0228)  ketorolac (TORADOL) 15 MG/ML injection 15 mg (15 mg Intravenous Given 06/28/14 0228)    Filed Vitals:   06/27/14 2213 06/28/14 0039  BP: 115/81 115/73  Pulse: 92 80  Temp: 98.1 F (36.7 C)   Resp: 18 16  Height: 6\' 1"  (1.854 m)   Weight: 210 lb (95.255 kg)   SpO2: 99% 100%    Final diagnoses:  Abdominal pain, generalized  Enteritis  Other hemorrhoids      Blane OharaJoshua Daylyn Christine, MD 07/03/14 [phone]

## 2014-06-27 NOTE — ED Notes (Signed)
Abdominal pain, nausea and diarrhea since yesterday.  

## 2014-06-27 NOTE — ED Notes (Signed)
MD at bedside. 

## 2014-06-28 LAB — COMPREHENSIVE METABOLIC PANEL
ALT: 47 U/L (ref 0–53)
AST: 36 U/L (ref 0–37)
Albumin: 5 g/dL (ref 3.5–5.2)
Alkaline Phosphatase: 54 U/L (ref 39–117)
Anion gap: 10 (ref 5–15)
BILIRUBIN TOTAL: 1.5 mg/dL — AB (ref 0.3–1.2)
BUN: 19 mg/dL (ref 6–23)
CALCIUM: 9.5 mg/dL (ref 8.4–10.5)
CHLORIDE: 103 mmol/L (ref 96–112)
CO2: 26 mmol/L (ref 19–32)
Creatinine, Ser: 1.13 mg/dL (ref 0.50–1.35)
GFR calc Af Amer: >90 mL/min (ref >90)
GFR calc non Af Amer: 80 mL/min — ABNORMAL LOW (ref >90)
Glucose, Bld: 117 mg/dL — ABNORMAL HIGH (ref 70–99)
Potassium: 4.2 mmol/L (ref 3.5–5.1)
SODIUM: 139 mmol/L (ref 135–145)
Total Protein: 9.2 g/dL — ABNORMAL HIGH (ref 6.0–8.3)

## 2014-06-28 LAB — CBC WITH DIFFERENTIAL/PLATELET
BASOS PCT: 0 % (ref 0–1)
Basophils Absolute: 0 10*3/uL (ref 0.0–0.1)
EOS ABS: 0 10*3/uL (ref 0.0–0.7)
EOS PCT: 0 % (ref 0–5)
HEMATOCRIT: 47.7 % (ref 39.0–52.0)
HEMOGLOBIN: 15.8 g/dL (ref 13.0–17.0)
Lymphocytes Relative: 11 % — ABNORMAL LOW (ref 12–46)
Lymphs Abs: 0.6 10*3/uL — ABNORMAL LOW (ref 0.7–4.0)
MCH: 23.8 pg — AB (ref 26.0–34.0)
MCHC: 33.1 g/dL (ref 30.0–36.0)
MCV: 71.8 fL — ABNORMAL LOW (ref 78.0–100.0)
MONO ABS: 0.4 10*3/uL (ref 0.1–1.0)
MONOS PCT: 7 % (ref 3–12)
Neutro Abs: 4.6 10*3/uL (ref 1.7–7.7)
Neutrophils Relative %: 82 % — ABNORMAL HIGH (ref 43–77)
Platelets: 178 10*3/uL (ref 150–400)
RBC: 6.64 MIL/uL — AB (ref 4.22–5.81)
RDW: 15.7 % — ABNORMAL HIGH (ref 11.5–15.5)
WBC: 5.6 10*3/uL (ref 4.0–10.5)

## 2014-06-28 LAB — LIPASE, BLOOD: LIPASE: 28 U/L (ref 11–59)

## 2014-06-28 MED ORDER — ONDANSETRON 4 MG PO TBDP: ORAL_TABLET | ORAL | Status: DC

## 2014-06-28 MED ORDER — HYDROCORTISONE 2.5 % RE CREA: TOPICAL_CREAM | RECTAL | Status: DC

## 2014-06-28 MED ORDER — GI COCKTAIL ~~LOC~~
30.0000 mL | Freq: Once | ORAL | Status: AC
  Administered 2014-06-28: 30 mL via ORAL
  Filled 2014-06-28: qty 30

## 2014-06-28 MED ORDER — LOPERAMIDE HCL 2 MG PO CAPS
4.0000 mg | ORAL_CAPSULE | Freq: Once | ORAL | Status: AC
  Administered 2014-06-28: 4 mg via ORAL
  Filled 2014-06-28: qty 2

## 2014-06-28 MED ORDER — HYDROCORTISONE ACETATE 25 MG RE SUPP
25.0000 mg | Freq: Once | RECTAL | Status: AC
Start: 2014-06-28 — End: 2014-06-28
  Administered 2014-06-28: 25 mg via RECTAL
  Filled 2014-06-28: qty 1

## 2014-06-28 MED ORDER — LOPERAMIDE HCL 2 MG PO CAPS: 2.0000 mg | ORAL_CAPSULE | Freq: Four times a day (QID) | ORAL | Status: DC | PRN

## 2014-06-28 MED ORDER — KETOROLAC TROMETHAMINE 15 MG/ML IJ SOLN
15.0000 mg | Freq: Once | INTRAMUSCULAR | Status: AC
  Administered 2014-06-28: 15 mg via INTRAVENOUS
  Filled 2014-06-28: qty 1

## 2014-06-28 NOTE — ED Notes (Signed)
Pt requests meds for Hemorid and gastric reflux EDP notified.

## 2014-06-28 NOTE — ED Notes (Signed)
Pt has been to the bathroom x4 with diarrhea. EDP notified.

## 2014-06-28 NOTE — Discharge Instructions (Signed)
If you were given medicines take as directed.  If you are on coumadin or contraceptives realize their levels and effectiveness is altered by many different medicines.  If you have any reaction (rash, tongues swelling, other) to the medicines stop taking and see a physician.   Please follow up as directed and return to the ER or see a physician for new or worsening symptoms.  Thank you. Filed Vitals:   06/27/14 2213 06/28/14 0039  BP: 115/81 115/73  Pulse: 92 80  Temp: 98.1 F (36.7 C)   Resp: 18 16  Height: 6\' 1"  (1.854 m)   Weight: 210 lb (95.255 kg)   SpO2: 99% 100%

## 2016-05-15 DIAGNOSIS — G4700 Insomnia, unspecified: Secondary | ICD-10-CM | POA: Diagnosis not present

## 2016-05-15 DIAGNOSIS — Z87898 Personal history of other specified conditions: Secondary | ICD-10-CM | POA: Diagnosis not present

## 2016-05-15 DIAGNOSIS — K219 Gastro-esophageal reflux disease without esophagitis: Secondary | ICD-10-CM | POA: Diagnosis not present

## 2016-05-15 DIAGNOSIS — R5383 Other fatigue: Secondary | ICD-10-CM | POA: Diagnosis not present

## 2016-07-17 DIAGNOSIS — Z0000 Encounter for general adult medical examination without abnormal findings: Secondary | ICD-10-CM | POA: Diagnosis not present

## 2016-11-12 DIAGNOSIS — M79604 Pain in right leg: Secondary | ICD-10-CM | POA: Diagnosis not present

## 2016-11-12 DIAGNOSIS — M79605 Pain in left leg: Secondary | ICD-10-CM | POA: Diagnosis not present

## 2018-02-09 DIAGNOSIS — R7301 Impaired fasting glucose: Secondary | ICD-10-CM | POA: Diagnosis not present

## 2018-02-09 DIAGNOSIS — K219 Gastro-esophageal reflux disease without esophagitis: Secondary | ICD-10-CM | POA: Diagnosis not present

## 2018-03-29 DIAGNOSIS — L299 Pruritus, unspecified: Secondary | ICD-10-CM | POA: Diagnosis not present

## 2018-03-29 DIAGNOSIS — R300 Dysuria: Secondary | ICD-10-CM | POA: Diagnosis not present

## 2018-11-02 ENCOUNTER — Emergency Department (HOSPITAL_BASED_OUTPATIENT_CLINIC_OR_DEPARTMENT_OTHER): Payer: Self-pay

## 2018-11-02 ENCOUNTER — Other Ambulatory Visit: Payer: Self-pay

## 2018-11-02 ENCOUNTER — Encounter (HOSPITAL_BASED_OUTPATIENT_CLINIC_OR_DEPARTMENT_OTHER): Payer: Self-pay | Admitting: *Deleted

## 2018-11-02 ENCOUNTER — Emergency Department (HOSPITAL_BASED_OUTPATIENT_CLINIC_OR_DEPARTMENT_OTHER)
Admission: EM | Admit: 2018-11-02 | Discharge: 2018-11-02 | Disposition: A | Discharge status: Home / Self Care | Payer: Self-pay | Attending: Emergency Medicine | Admitting: Emergency Medicine

## 2018-11-02 DIAGNOSIS — E119 Type 2 diabetes mellitus without complications: Secondary | ICD-10-CM | POA: Insufficient documentation

## 2018-11-02 DIAGNOSIS — Z79899 Other long term (current) drug therapy: Secondary | ICD-10-CM | POA: Insufficient documentation

## 2018-11-02 DIAGNOSIS — K625 Hemorrhage of anus and rectum: Secondary | ICD-10-CM | POA: Insufficient documentation

## 2018-11-02 DIAGNOSIS — R1013 Epigastric pain: Secondary | ICD-10-CM

## 2018-11-02 DIAGNOSIS — R109 Unspecified abdominal pain: Secondary | ICD-10-CM

## 2018-11-02 DIAGNOSIS — Z7984 Long term (current) use of oral hypoglycemic drugs: Secondary | ICD-10-CM | POA: Insufficient documentation

## 2018-11-02 DIAGNOSIS — D649 Anemia, unspecified: Secondary | ICD-10-CM

## 2018-11-02 LAB — CBC WITH DIFFERENTIAL/PLATELET
Abs Immature Granulocytes: 0.01 10*3/uL (ref 0.00–0.07)
Basophils Absolute: 0 10*3/uL (ref 0.0–0.1)
Basophils Relative: 1 %
Eosinophils Absolute: 0.2 10*3/uL (ref 0.0–0.5)
Eosinophils Relative: 4 %
HCT: 31.2 % — ABNORMAL LOW (ref 39.0–52.0)
Hemoglobin: 9.1 g/dL — ABNORMAL LOW (ref 13.0–17.0)
Immature Granulocytes: 0 %
Lymphocytes Relative: 46 %
Lymphs Abs: 2 10*3/uL (ref 0.7–4.0)
MCH: 20.6 pg — ABNORMAL LOW (ref 26.0–34.0)
MCHC: 29.2 g/dL — ABNORMAL LOW (ref 30.0–36.0)
MCV: 70.7 fL — ABNORMAL LOW (ref 80.0–100.0)
Monocytes Absolute: 0.4 10*3/uL (ref 0.1–1.0)
Monocytes Relative: 10 %
Neutro Abs: 1.7 10*3/uL (ref 1.7–7.7)
Neutrophils Relative %: 39 %
Platelets: 181 10*3/uL (ref 150–400)
RBC: 4.41 MIL/uL (ref 4.22–5.81)
RDW: 17.6 % — ABNORMAL HIGH (ref 11.5–15.5)
WBC: 4.3 10*3/uL (ref 4.0–10.5)
nRBC: 0 % (ref 0.0–0.2)

## 2018-11-02 LAB — URINALYSIS, ROUTINE W REFLEX MICROSCOPIC
Bilirubin Urine: NEGATIVE
Glucose, UA: NEGATIVE mg/dL
Hgb urine dipstick: NEGATIVE
Ketones, ur: NEGATIVE mg/dL
Leukocytes,Ua: NEGATIVE
Nitrite: NEGATIVE
Protein, ur: NEGATIVE mg/dL
Specific Gravity, Urine: 1.01 (ref 1.005–1.030)
pH: 7 (ref 5.0–8.0)

## 2018-11-02 LAB — COMPREHENSIVE METABOLIC PANEL
ALT: 15 U/L (ref 0–44)
AST: 16 U/L (ref 15–41)
Albumin: 4 g/dL (ref 3.5–5.0)
Alkaline Phosphatase: 41 U/L (ref 38–126)
Anion gap: 7 (ref 5–15)
BUN: 9 mg/dL (ref 6–20)
CO2: 27 mmol/L (ref 22–32)
Calcium: 9.3 mg/dL (ref 8.9–10.3)
Chloride: 105 mmol/L (ref 98–111)
Creatinine, Ser: 0.99 mg/dL (ref 0.61–1.24)
GFR calc Af Amer: >60 mL/min (ref >60)
GFR calc non Af Amer: >60 mL/min (ref >60)
Glucose, Bld: 102 mg/dL — ABNORMAL HIGH (ref 70–99)
Potassium: 4.2 mmol/L (ref 3.5–5.1)
Sodium: 139 mmol/L (ref 135–145)
Total Bilirubin: 0.8 mg/dL (ref 0.3–1.2)
Total Protein: 7.2 g/dL (ref 6.5–8.1)

## 2018-11-02 LAB — TROPONIN I (HIGH SENSITIVITY)
Troponin I (High Sensitivity): 4 ng/L (ref <18)
Troponin I (High Sensitivity): 4 ng/L (ref <18)

## 2018-11-02 LAB — LIPASE, BLOOD: Lipase: 28 U/L (ref 11–51)

## 2018-11-02 MED ORDER — OXYCODONE-ACETAMINOPHEN 5-325 MG PO TABS
1.0000 | ORAL_TABLET | Freq: Once | ORAL | Status: AC
  Administered 2018-11-02: 1 via ORAL
  Filled 2018-11-02: qty 1

## 2018-11-02 MED ORDER — ALUM & MAG HYDROXIDE-SIMETH 200-200-20 MG/5ML PO SUSP
30.0000 mL | Freq: Once | ORAL | Status: AC
  Administered 2018-11-02: 08:00:00 30 mL via ORAL
  Filled 2018-11-02: qty 30

## 2018-11-02 MED ORDER — OXYCODONE-ACETAMINOPHEN 5-325 MG PO TABS: 1.0000 | ORAL_TABLET | ORAL | 0 refills | Status: DC | PRN

## 2018-11-02 MED ORDER — OMEPRAZOLE 20 MG PO CPDR: 20.0000 mg | DELAYED_RELEASE_CAPSULE | Freq: Every day | ORAL | 0 refills | Status: DC

## 2018-11-02 MED ORDER — HYDROCORTISONE ACETATE 25 MG RE SUPP: 25.0000 mg | Freq: Two times a day (BID) | RECTAL | 0 refills | Status: DC

## 2018-11-02 MED ORDER — PANTOPRAZOLE SODIUM 40 MG PO TBEC
40.0000 mg | DELAYED_RELEASE_TABLET | Freq: Every day | ORAL | Status: DC
  Administered 2018-11-02: 40 mg via ORAL
  Filled 2018-11-02: qty 1

## 2018-11-02 MED ORDER — LIDOCAINE VISCOUS HCL 2 % MT SOLN
15.0000 mL | Freq: Once | OROMUCOSAL | Status: AC
  Administered 2018-11-02: 08:00:00 15 mL via ORAL
  Filled 2018-11-02: qty 15

## 2018-11-02 NOTE — ED Provider Notes (Signed)
[facility] EMERGENCY DEPARTMENT Provider Note   CSN: [id number] Arrival date & time: 11/02/18  [id number]     History   Chief Complaint Chief Complaint  Patient presents with  . Abdominal Pain    HPI Kurt Russell is a 43 y.o. male.     The history is provided by the patient and medical records. No language interpreter was used.  Abdominal Pain Pain location:  Epigastric, L flank and R flank Pain quality: aching and cramping   Pain radiates to:  Does not radiate Pain severity:  Severe Onset quality:  Gradual Duration:  5 days Timing:  Constant Progression:  Waxing and waning Context: not previous surgeries, not sick contacts and not trauma   Relieved by:  Nothing Worsened by:  Nothing Ineffective treatments:  Bowel activity and OTC medications Associated symptoms: no anorexia, no chest pain, no chills, no constipation, no cough, no diarrhea, no dysuria, no fatigue, no fever, no nausea, no shortness of breath and no vomiting     Past Medical History:  Diagnosis Date  . Anemia   . Diabetes mellitus without complication (HCC)   . GERD (gastroesophageal reflux disease)   . Hyperlipidemia     There are no active problems to display for this patient.   No past surgical history on file.      Home Medications    Prior to Admission medications   Medication Sig Start Date End Date Taking? Authorizing Provider  famotidine (PEPCID) 10 MG tablet Take 10 mg by mouth 2 (two) times daily.   Yes [provider]  HYDROcodone-acetaminophen (NORCO/VICODIN) 5-325 MG per tablet Take 2 tablets by mouth every 4 (four) hours as needed for pain. 12/23/12   Teressa LowerPickering, Vrinda, NP  hydrocortisone (ANUSOL-HC) 2.5 % rectal cream Apply rectally 2 times daily 06/28/14   Blane OharaZavitz, Joshua, MD  IRON PO Take by mouth.    [provider]  loperamide (IMODIUM) 2 MG capsule Take 1 capsule (2 mg total) by mouth 4 (four) times daily as needed for diarrhea or loose stools. 06/28/14    Blane OharaZavitz, Joshua, MD  metFORMIN (GLUCOPHAGE) 500 MG tablet Take 500 mg by mouth 2 (two) times daily with a meal.    [provider]  ondansetron (ZOFRAN ODT) 4 MG disintegrating tablet 4mg  ODT q4 hours prn nausea/vomit 06/28/14   Blane OharaZavitz, Joshua, MD    Family History No family history on file.  Social History Social History   Tobacco Use  . Smoking status: Never Smoker  . Smokeless tobacco: Never Used  Substance Use Topics  . Alcohol use: No  . Drug use: No     Allergies   Patient has no known allergies.   Review of Systems Review of Systems  Constitutional: Negative for chills, diaphoresis, fatigue and fever.  HENT: Negative for congestion.   Eyes: Negative for visual disturbance.  Respiratory: Negative for cough, choking, chest tightness, shortness of breath and wheezing.   Cardiovascular: Negative for chest pain, palpitations and leg swelling.  Gastrointestinal: Positive for abdominal pain and blood in stool. Negative for abdominal distention, anorexia, constipation, diarrhea, nausea and vomiting.  Genitourinary: Positive for flank pain. Negative for dysuria and frequency.  Musculoskeletal: Negative for back pain, neck pain and neck stiffness.  Skin: Negative for rash and wound.  Neurological: Negative for light-headedness and headaches.  Psychiatric/Behavioral: Negative for agitation.  All other systems reviewed and are negative.    Physical Exam Updated Vital Signs BP (!) 137/99 (BP Location: Right Arm)   Pulse  79   Temp 98.9 F (37.2 C) (Oral)   Resp 16   Ht 6' (1.829 m)   Wt 96.1 kg   SpO2 100%   BMI 28.74 kg/m   Physical Exam Vitals signs and nursing note reviewed.  Constitutional:      General: He is not in acute distress.    Appearance: He is well-developed. He is not ill-appearing, toxic-appearing or diaphoretic.  HENT:     Head: Normocephalic and atraumatic.  Eyes:     General: No scleral icterus.    Conjunctiva/sclera: Conjunctivae  normal.  Neck:     Musculoskeletal: Neck supple.  Cardiovascular:     Rate and Rhythm: Normal rate and regular rhythm.     Heart sounds: No murmur.  Pulmonary:     Effort: Pulmonary effort is normal. No respiratory distress.     Breath sounds: Normal breath sounds. No wheezing, rhonchi or rales.  Chest:     Chest wall: No tenderness.  Abdominal:     Palpations: Abdomen is soft.     Tenderness: There is abdominal tenderness in the epigastric area. There is no right CVA tenderness, left CVA tenderness, guarding or rebound.  Genitourinary:    Comments: Refused rectal exam  Skin:    General: Skin is warm and dry.     Capillary Refill: Capillary refill takes less than 2 seconds.     Findings: No rash.  Neurological:     General: No focal deficit present.     Mental Status: He is alert.  Psychiatric:        Mood and Affect: Mood normal.      ED Treatments / Results  Labs (all labs ordered are listed, but only abnormal results are displayed) Labs Reviewed  CBC WITH DIFFERENTIAL/PLATELET - Abnormal; Notable for the following components:      Result Value   Hemoglobin 9.1 (*)    HCT 31.2 (*)    MCV 70.7 (*)    MCH 20.6 (*)    MCHC 29.2 (*)    RDW 17.6 (*)    All other components within normal limits  COMPREHENSIVE METABOLIC PANEL - Abnormal; Notable for the following components:   Glucose, Bld 102 (*)    All other components within normal limits  LIPASE, BLOOD  URINALYSIS, ROUTINE W REFLEX MICROSCOPIC  TROPONIN I (HIGH SENSITIVITY)  TROPONIN I (HIGH SENSITIVITY)    EKG EKG Interpretation  Date/Time:  Tuesday November 02 2018 08:34:17 EDT Ventricular Rate:  66 PR Interval:    QRS Duration: 90 QT Interval:  397 QTC Calculation: 416 R Axis:   38 Text Interpretation:  Sinus rhythm Abnormal R-wave progression, early transition Abnormal T, consider ischemia, anterior leads When compared to prior, similar t wave ivnersions to prior.  No STEMI Confirmed by Theda Belfastegeler, Chris  [phone]([id number]) on 11/02/2018 8:41:49 AM   Radiology [place] Abdomen Limited Ruq  Result Date: 11/02/2018 CLINICAL DATA:  Heartburn and right upper quadrant pain since 10/29/2018. EXAM: ULTRASOUND ABDOMEN LIMITED RIGHT UPPER QUADRANT COMPARISON:  CT abdomen and pelvis 12/23/2012. FINDINGS: Gallbladder: No gallstones or wall thickening visualized. No sonographic Murphy sign noted by sonographer. Common bile duct: Diameter: 0.3 cm. Liver: No focal lesion identified. Within normal limits in parenchymal echogenicity. Portal vein is patent on color Doppler imaging with normal direction of blood flow towards the liver. IMPRESSION: Negative for gallstones.  Negative exam. Electronically Signed   By: Drusilla Kannerhomas  Dalessio M.D.   On: 11/02/2018 09:39    Procedures Procedures (including critical care time)  Medications Ordered in ED Medications  pantoprazole (PROTONIX) EC tablet 40 mg (40 mg Oral Given 11/02/18 1117)  alum & mag hydroxide-simeth (MAALOX/MYLANTA) 200-200-20 MG/5ML suspension 30 mL (30 mLs Oral Given 11/02/18 0823)    And  lidocaine (XYLOCAINE) 2 % viscous mouth solution 15 mL (15 mLs Oral Given 11/02/18 0823)  oxyCODONE-acetaminophen (PERCOCET/ROXICET) 5-325 MG per tablet 1 tablet (1 tablet Oral Given 11/02/18 1117)     Initial Impression / Assessment and Plan / ED Course  I have reviewed the triage vital signs and the nursing notes.  Pertinent labs & imaging results that were available during my care of the patient were reviewed by me and considered in my medical decision making (see chart for details).        Kenley Jehle is a 43 y.o. male with a past medical history significant for GERD, hyperlipidemia, diabetes who presents with epigastric abdominal pain radiating to his flanks.  He reports that he has had this pain several years ago and was told it was likely reflux.  He says that he did some sit ups on Thursday and then since Friday has been having the epigastric and flank pains.  He reports that  he has had no significant nausea and vomiting but took a colon cleanse to see if this would help.  He reports he is had lots of bowel movements and even had a very small amount of rectal bleeding which he attributes to hemorrhoids.  He denies any rectal or hemorrhoid pain and is not interested in getting evaluated for his hemorrhoids at this time.  He does request a prescription for Anusol again to help with this which will likely be prescribed at the completion of his work-up.  Patient describes the pain is up to 9 out of 10 in severity.  He denies fevers, chills, chest pain, shortness breath or palpitations.  Denies diaphoresis.  He denies any trauma.  He reports he is never had abdominal surgeries and has never been told he had gallstones.  He reports that several years ago he did have an ultrasound of his abdomen as well.  He denies other complaints.  On exam, lungs clear chest nontender.  Abdomen is tender across the upper abdomen and epigastric area.  No CVA tenderness.  Back nontender.  Legs normal pulses.  Upper tremors and normal pulses.  Patient resting comfortably but complaining of did not have 10 abdominal discomfort.  Clinically I am concerned his symptoms are primarily due to either musculoskeletal versus reflux discomfort.  Will check EKG however low suspicion for cardiac etiology of symptoms.  His breath sounds were normal and his breathing has no complaints, will hold on chest x-ray at this time.  We will get right upper quadrant ultrasound and labs to assess his gallbladder and abdomen.  We will get urinalysis to look for sediment or blood which would suggest possible kidney stones given the flank involvement.  He will be given a GI cocktail and if needed, will receive pain and nausea medicine.  Anticipate reassessment after work-up.  Patient's hemoglobin returned lower than his baseline, suspects the rectal bleeding may contribute to this.  He reports it is not still bleeding and he is  not having any symptoms of anemia at this time.  He denies any dark stools and still does not want a rectal exam or fecal occult test.  His troponin was negative x2 and metabolic panel was reassuring.  CBC otherwise reassuring.  Lipase not elevated.  Patient will much  better after the pain medicine and GI cocktail.  Right upper quadrant ultrasound showed no significant abnormalities.  Suspect reflux as the main cause of his symptoms.  Lower suspicion for significant upper GI bleed given his lack of hematemesis or current vomiting.  He denied dark tarry stools, doubt significant GI bleed.  He did report the scant bleeding with his stools with the diarrhea with his bowel movements from the hemorrhoids.  He requested a prescription for Anusol to help with his hemorrhoids which was ordered.  Patient given prescription for Prilosec and pain medication he will follow-up with his PCP.  He understood strict return precautions and had no other questions or concerns.  Patient was discharged in good condition.   Final Clinical Impressions(s) / ED Diagnoses   Final diagnoses:  Epigastric pain  Anemia, unspecified type  Abdominal pain, unspecified abdominal location    ED Discharge Orders         Ordered    omeprazole (PRILOSEC) 20 MG capsule  Daily     11/02/18 1416    oxyCODONE-acetaminophen (PERCOCET/ROXICET) 5-325 MG tablet  Every 4 hours PRN     11/02/18 1416    hydrocortisone (ANUSOL-HC) 25 MG suppository  2 times daily     11/02/18 1422          Clinical Impression: 1. Anemia, unspecified type   2. Epigastric pain   3. Abdominal pain, unspecified abdominal location     Disposition: Discharge  Condition: Good  I have discussed the results, Dx and Tx plan with the pt(& family if present). He/she/they expressed understanding and agree(s) with the plan. Discharge instructions discussed at great length. Strict return precautions discussed and pt &/or family have verbalized understanding of  the instructions. No further questions at time of discharge.    Discharge Medication List as of 11/02/2018  2:23 PM    START taking these medications   Details  hydrocortisone (ANUSOL-HC) 25 MG suppository Place 1 suppository (25 mg total) rectally 2 (two) times daily., Starting Tue 11/02/2018, Normal    omeprazole (PRILOSEC) 20 MG capsule Take 1 capsule (20 mg total) by mouth daily., Starting Tue 11/02/2018, Normal    oxyCODONE-acetaminophen (PERCOCET/ROXICET) 5-325 MG tablet Take 1 tablet by mouth every 4 (four) hours as needed for severe pain., Starting Tue 11/02/2018, Normal        Follow Up: Gevena Mart, DO [address] [place] [address] [phone]     [facility] EMERGENCY DEPARTMENT [address] [id number] [address] [place] [phone]       Lawana Hartzell, Gwenyth Allegra, MD 11/02/18 [phone]

## 2018-11-02 NOTE — ED Triage Notes (Addendum)
Pt reports since Friday his heartburn epigastric burning has been acting up, states he is having to sleep sitting up due to the epigastric burning at night. Also he has a hemorrhoid that has flared back up since he started eating meat again a few months ago. Denies any fevers, n/v, last bm this am, normal. Pt states he drank a colon cleansing drink he bought at [employer] on Friday to try to help "clean his stomach" but has felt worse since.

## 2018-11-02 NOTE — ED Notes (Signed)
ED Provider at bedside. 

## 2018-11-02 NOTE — Discharge Instructions (Addendum)
Your work-up today was overall reassuring.  We suspect your symptoms are related to worsening of your known reflux.  Your ultrasound did not show evidence of gallbladder disease and your labs showed no abnormality with your liver or pancreas.  Your hemoglobin had dropped and might be related to some of the rectal bleeding you are reporting.  Without the severe rectal pain, low suspicion for a thrombosed hemorrhoid.  We discussed doing a rectal exam however he would prefer to get a prescription of Anusol and follow-up with your PCP which is reasonable.  Please stay hydrated and rest and use the medicine to help with your reflux.  Please follow-up.  If any symptoms change or worsen, please return to nearest emergency department.

## 2019-01-19 ENCOUNTER — Other Ambulatory Visit: Payer: Self-pay

## 2019-01-19 DIAGNOSIS — Z20822 Contact with and (suspected) exposure to covid-19: Secondary | ICD-10-CM

## 2019-01-21 LAB — NOVEL CORONAVIRUS, NAA: SARS-CoV-2, NAA: DETECTED — AB

## 2019-06-06 ENCOUNTER — Ambulatory Visit: Payer: 59 | Attending: Internal Medicine

## 2019-06-06 DIAGNOSIS — Z20822 Contact with and (suspected) exposure to covid-19: Secondary | ICD-10-CM

## 2019-06-07 LAB — NOVEL CORONAVIRUS, NAA: SARS-CoV-2, NAA: NOT DETECTED

## 2019-09-26 ENCOUNTER — Ambulatory Visit: Payer: HRSA Program | Attending: Internal Medicine

## 2019-09-26 DIAGNOSIS — Z20822 Contact with and (suspected) exposure to covid-19: Secondary | ICD-10-CM | POA: Insufficient documentation

## 2019-09-27 LAB — NOVEL CORONAVIRUS, NAA: SARS-CoV-2, NAA: NOT DETECTED

## 2019-09-27 LAB — SARS-COV-2, NAA 2 DAY TAT

## 2019-10-08 IMAGING — US ULTRASOUND ABDOMEN LIMITED
1 series · 14 of 25 positions shown · non-contrast
Comparison: CT abdomen and pelvis 12/23/2012.

CLINICAL DATA: Heartburn and right upper quadrant pain since
10/29/2018.

EXAM:
ULTRASOUND ABDOMEN LIMITED RIGHT UPPER QUADRANT

[Series 1: ultrasound abdomen limited · 14 of 56 slices shown]
[im 1/56]
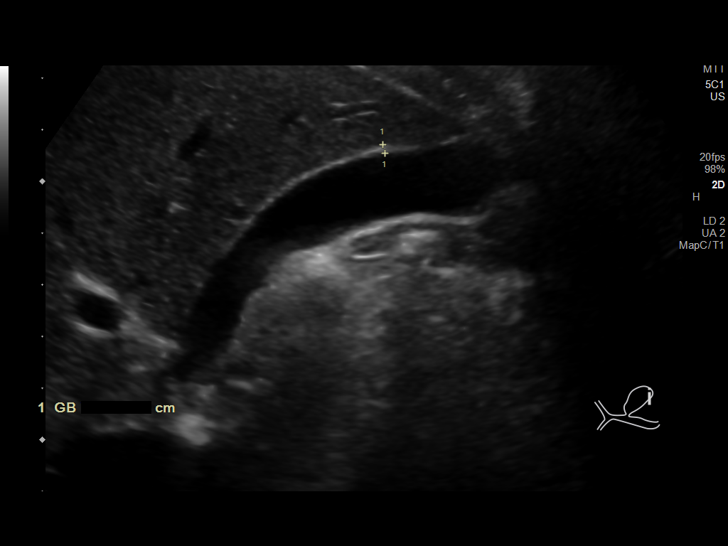
[im 5/56]
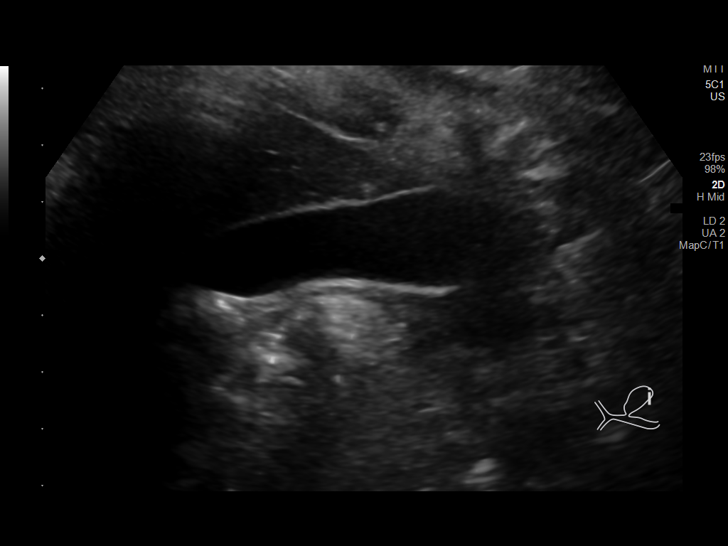
[im 10/56]
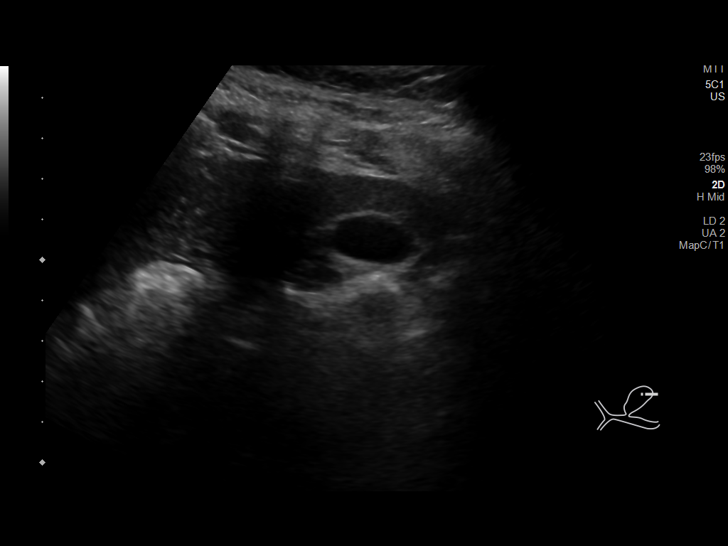
[im 14/56]
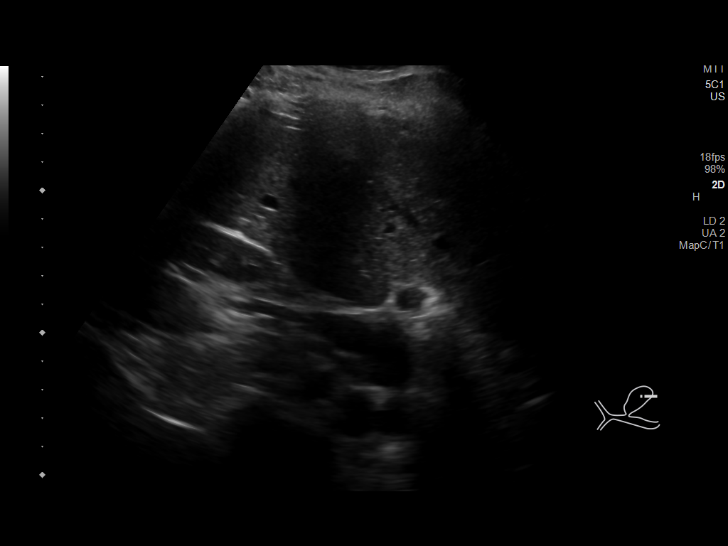
[im 19/56]
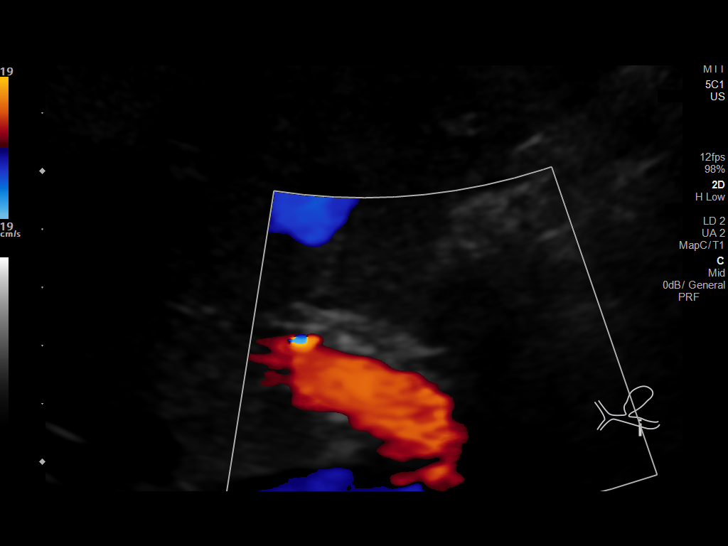
[im 21/56]
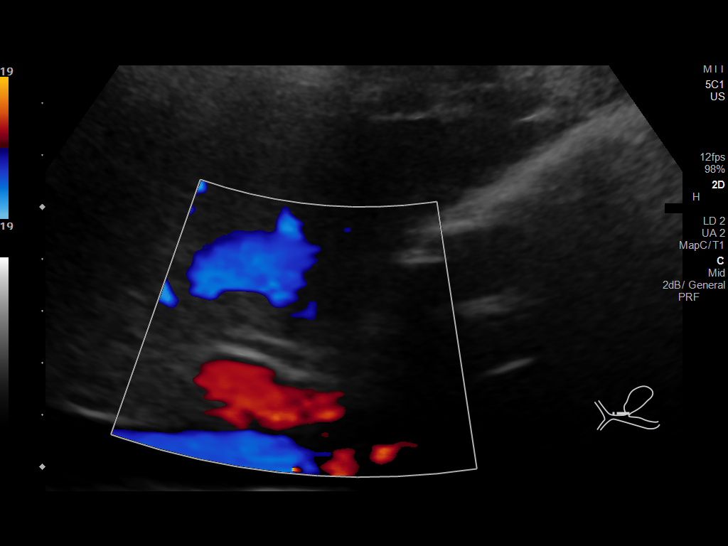
[im 26/56]
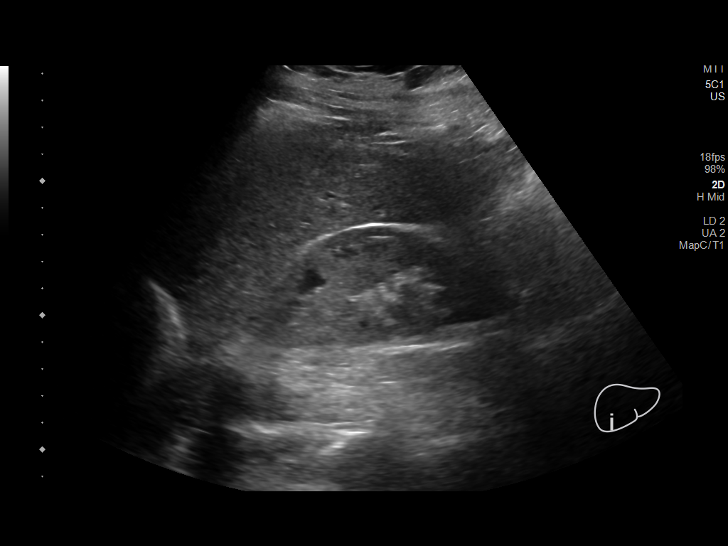
[im 30/56]
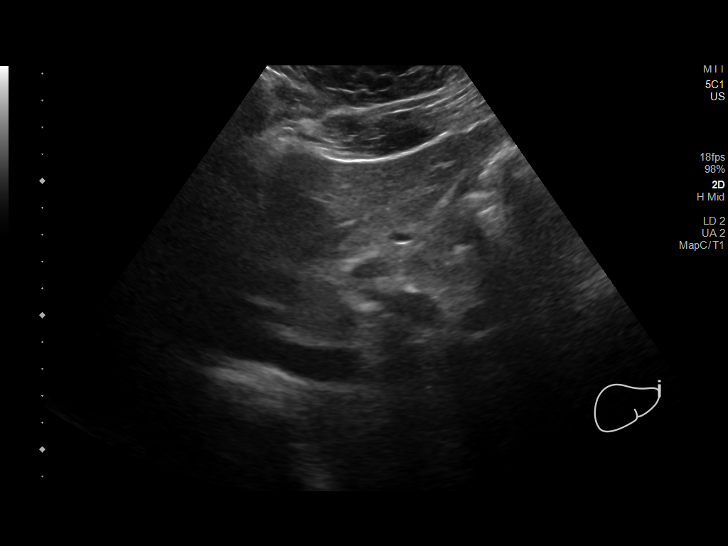
[im 35/56]
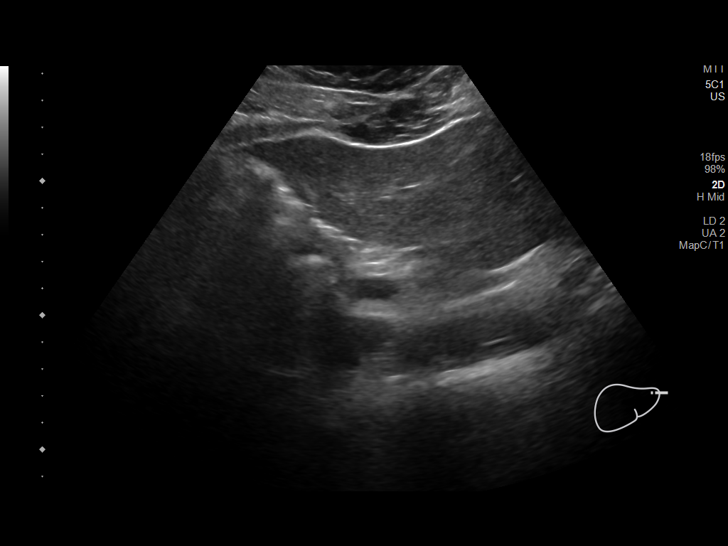
[im 37/56]
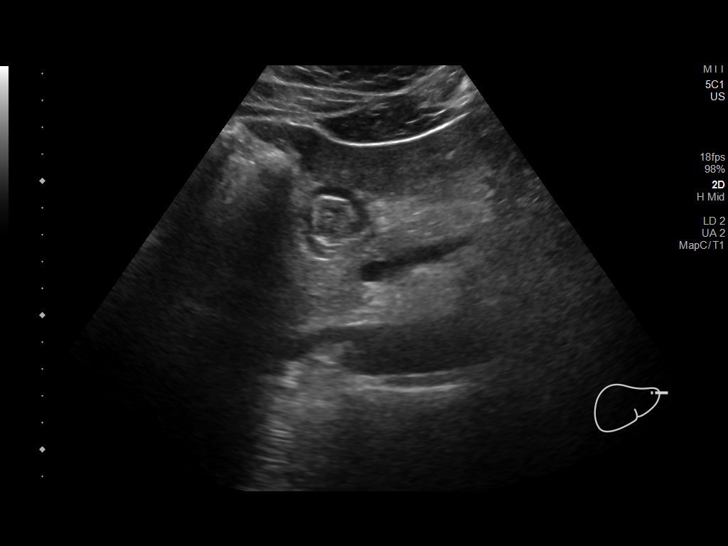
[im 42/56]
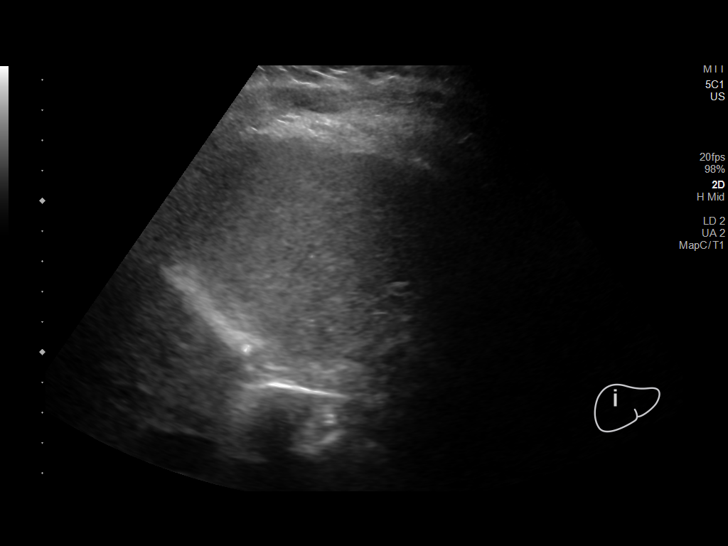
[im 46/56]
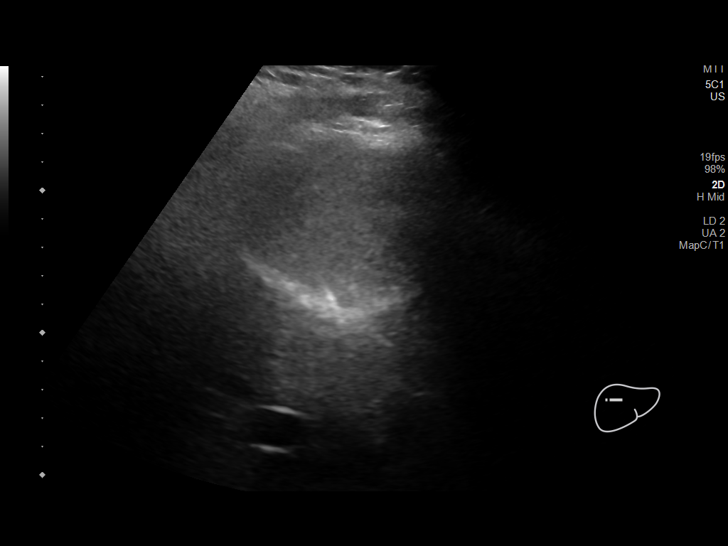
[im 51/56]
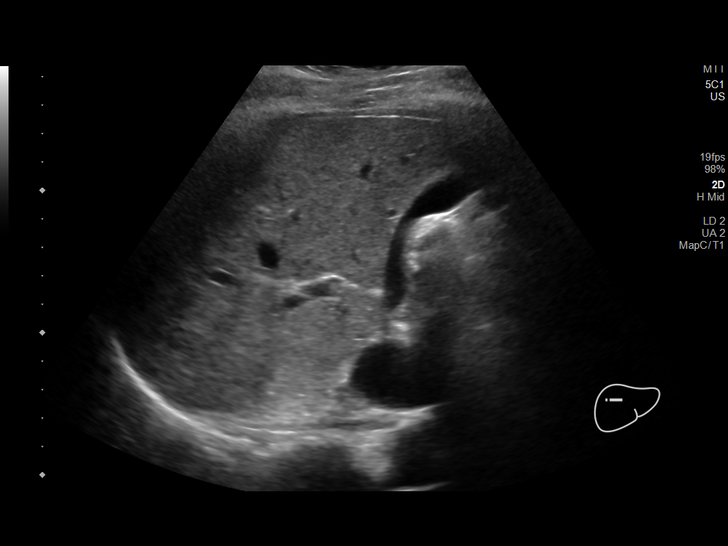
[im 56/56]
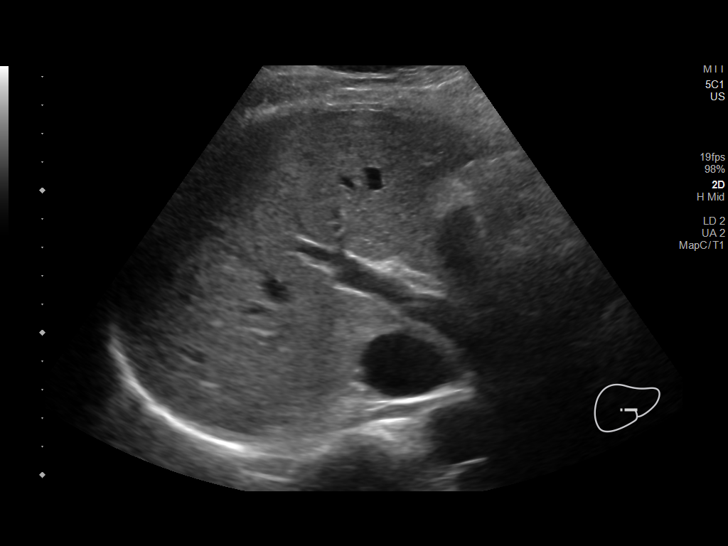

[14 of 25 positions shown; findings below may reference images not displayed]

FINDINGS: Gallbladder:

No gallstones or wall thickening visualized. No sonographic Murphy
sign noted by sonographer.

Common bile duct:

Diameter: 0.3 cm.

Liver:

No focal lesion identified. Within normal limits in parenchymal
echogenicity. Portal vein is patent on color Doppler imaging with
normal direction of blood flow towards the liver.
IMPRESSION: Negative for gallstones.  Negative exam.

## 2019-11-24 ENCOUNTER — Other Ambulatory Visit: Payer: Self-pay

## 2019-11-24 DIAGNOSIS — Z20822 Contact with and (suspected) exposure to covid-19: Secondary | ICD-10-CM

## 2019-11-25 LAB — NOVEL CORONAVIRUS, NAA: SARS-CoV-2, NAA: NOT DETECTED

## 2019-11-25 LAB — SARS-COV-2, NAA 2 DAY TAT

## 2020-09-11 ENCOUNTER — Encounter: Payer: Self-pay | Admitting: Surgery

## 2020-09-11 ENCOUNTER — Ambulatory Visit: Payer: Self-pay | Admitting: Surgery

## 2020-09-11 NOTE — H&P (Signed)
[place] Appointment: 09/11/2020 9:30 AM Location: [facility] Patient #: [id number] DOB: February 24, 1976 Married / Language: English / Race: Black or African American Male  History of Present Illness Ardeth Sportsman MD; 09/11/2020 1:32 PM) The patient is a 45 year old male who presents with hemorrhoids. Note for "Hemorrhoids": ` ` ` Patient sent for surgical consultation at the request of Dr Marca Ancona  Chief Complaint: Bleeding and hemorrhoids. "I was told to come here" ` ` The patient is a pleasant gentleman [place] but has lived in the [place] for quite a while. He is a Education officer, environmental. Coaches soccer. He struggled with iron deficiency anemia and intermittent severe episodes of bright rectal bleeding. Sent to [facility]. Underwent upper and lower endoscopy. Mostly underwhelming. However significant external hemorrhoids noted. Surgical consultation offered. Patient is here by himself. He usually moves his bowels once or twice a day. Denies much the way of hemorrhoidal pain or discomfort but has been given suppositories we he notes has helped. He feels convinced that if he has meat or bread and his diet that triggers his bowels to become more bloody. He tries to avoid that. He's had a vasectomy but no other surgeries. He's never had any anorectal interventions. Looks like he's been diagnosed with thrombosed hemorrhoids and ER visits in the past but never anything lanced. He also notes he gets some occasional right-sided abdominal pain. Usually eats worse if he moves a certain position. Usually if he passes gas or has a bowel movement he feels better. He did have an ultrasound done many years ago that showed no gallstones. Because the hemorrhoids seem to be the only source of bleeding, gastrology recommended surgical evaluation.  (Review of systems as stated in this history (HPI) or in the review of systems. Otherwise all other 12 point ROS are  negative) ` ` ###########################################`  This patient encounter took 30 minutes today to perform the following: obtain history, perform exam, review outside records, interpret tests & imaging, counsel the patient on their diagnosis; and, document this encounter, including findings & plan in the electronic health record (EHR).   Past Surgical History Ezequiel Essex Bond, CMA; 09/11/2020 9:35 AM) Vasectomy  Allergies (Jocelyn Bond, CMA; 09/11/2020 9:43 AM) No Known Drug Allergies [09/11/2020]: Allergies Reconciled  Medication History (Jocelyn Bond, CMA; 09/11/2020 9:46 AM) Sildenafil Citrate (50MG  Tablet, Oral) Active. Iron (325 (65 Fe)MG Tablet, Oral) Active. Anucort-HC (25MG  Suppository, Rectal) Active. Dulcolax (5MG  Tablet DR, Oral) Active. GaviLyte-C (240GM For Solution, Oral) Active. Multiple Vitamin (Oral) Active. Vitamin C (100MG  Tablet Chewable, Oral) Active. Vitamin D (400UNIT Capsule, Oral) Active. Zinc (10MG  Tablet, Oral) Active. Colace (50MG  Capsule, Oral) Active. MiraLax (17GM Packet, Oral) Active. Famotidine (10MG  Tablet, Oral) Active. Omeprazole (20MG  Capsule DR, Oral) Active. Medications Reconciled  Social History Bond, CMA; 09/11/2020 9:46 AM) No alcohol use No drug use Tobacco use Never smoker.  Family History , CMA; 09/11/2020 9:46 AM) Diabetes Mellitus Father, Mother. Heart Disease Father. Hypertension Father, Mother.  Other Problems (Jocelyn Bond, CMA; 09/11/2020 9:46 AM) Gastroesophageal Reflux Disease    Vitals (Jocelyn Bond CMA; 09/11/2020 9:43 AM) 09/11/2020 9:42 AM Weight: 217.25 lb Height: 72in Body Surface Area: 2.21 m Body Mass Index: 29.46 kg/m  Temp.: 97.66F  Pulse: 93 (Regular)  P.OX: 99% (Room air) BP: 120/88(Sitting, Left Arm, Standard)        Physical Exam MD; 09/11/2020 10:17 AM)  General Mental Status-Alert. General Appearance-Not in  acute distress, Not Sickly. Orientation-Oriented X3.  Hydration-Well hydrated. Voice-Normal.  Integumentary Global Assessment Upon inspection and palpation of skin surfaces of the - Axillae: non-tender, no inflammation or ulceration, no drainage. and Distribution of scalp and body hair is normal. General Characteristics Temperature - normal warmth is noted.  Head and Neck Head-normocephalic, atraumatic with no lesions or palpable masses. Face Global Assessment - atraumatic, no absence of expression. Neck Global Assessment - no abnormal movements, no bruit auscultated on the right, no bruit auscultated on the left, no decreased range of motion, non-tender. Trachea-midline. Thyroid Gland Characteristics - non-tender.  Eye Eyeball - Left-Extraocular movements intact, No Nystagmus - Left. Eyeball - Right-Extraocular movements intact, No Nystagmus - Right. Cornea - Left-No Hazy - Left. Cornea - Right-No Hazy - Right. Sclera/Conjunctiva - Left-No scleral icterus, No Discharge - Left. Sclera/Conjunctiva - Right-No scleral icterus, No Discharge - Right. Pupil - Left-Direct reaction to light normal. Pupil - Right-Direct reaction to light normal.  ENMT Ears Pinna - Left - no drainage observed, no generalized tenderness observed. Pinna - Right - no drainage observed, no generalized tenderness observed. Nose and Sinuses External Inspection of the Nose - no destructive lesion observed. Inspection of the nares - Left - quiet respiration. Inspection of the nares - Right - quiet respiration. Mouth and Throat Lips - Upper Lip - no fissures observed, no pallor noted. Lower Lip - no fissures observed, no pallor noted. Nasopharynx - no discharge present. Oral Cavity/Oropharynx - Tongue - no dryness observed. Oral Mucosa - no cyanosis observed. Hypopharynx - no evidence of airway distress observed.  Chest and Lung Exam Inspection Movements - Normal and Symmetrical.  Accessory muscles - No use of accessory muscles in breathing. Palpation Palpation of the chest reveals - Non-tender. Auscultation Breath sounds - Normal and Clear.  Cardiovascular Auscultation Rhythm - Regular. Murmurs & Other Heart Sounds - Auscultation of the heart reveals - No Murmurs and No Systolic Clicks.  Abdomen Inspection Inspection of the abdomen reveals - No Visible peristalsis and No Abnormal pulsations. Umbilicus - No Bleeding, No Urine drainage. Palpation/Percussion Palpation and Percussion of the abdomen reveal - Soft, Non Tender, No Rebound tenderness, No Rigidity (guarding) and No Cutaneous hyperesthesia. Note: Abdomen soft. Nontender. Not distended. Mild diastases recti. Small periumbilical hernia easily reduces. No Murphy sign. No guarding.  Male Genitourinary Sexual Maturity Tanner 5 - Adult hair pattern and Adult penile size and shape. Note: No inguinal hernias. Normal external genitalia. Epididymi, testes, and spermatic cords normal without any masses.  Rectal Note: #########################  Prolapsing left lateral anal crypt polyp irritated.  Perianal skin clean with good hygiene. No pruritis ani. No pilonidal disease. No fissure. No abscess/fistula. Normal sphincter tone.  No other external hemorrhoids. No condyloma warts.  Tolerates digital and anoscopic rectal exam but sensitive. Enlarged right anterior grade 3 polyp prolapses out. Right posterior tibial. Left lateral friable and easily bleeds at least grade 2 borderline grade 3. Prostate smooth without any obvious masses.  Peripheral Vascular Upper Extremity Inspection - Left - No Cyanotic nailbeds - Left, Not Ischemic. Inspection - Right - No Cyanotic nailbeds - Right, Not Ischemic.  Neurologic Neurologic evaluation reveals -normal attention span and ability to concentrate, able to name objects and repeat phrases. Appropriate fund of knowledge , normal sensation and normal  coordination. Mental Status Affect - not angry, not paranoid. Cranial Nerves-Normal Bilaterally. Gait-Normal.  Neuropsychiatric Mental status exam performed with findings of-able to articulate well with normal speech/language, rate, volume and coherence, thought content normal with ability to perform basic computations and apply  abstract reasoning and no evidence of hallucinations, delusions, obsessions or homicidal/suicidal ideation.  Musculoskeletal Global Assessment Spine, Ribs and Pelvis - no instability, subluxation or laxity. Right Upper Extremity - no instability, subluxation or laxity.  Lymphatic Head & Neck  General Head & Neck Lymphatics: Bilateral - Description - No Localized lymphadenopathy. Axillary  General Axillary Region: Bilateral - Description - No Localized lymphadenopathy. Femoral & Inguinal  Generalized Femoral & Inguinal Lymphatics: Left - Description - No Localized lymphadenopathy. Right - Description - No Localized lymphadenopathy.    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 No other source cancer for obvious irritated internal hemorrhoids. Now they're intermittently prolapsing on the right and prolapsed on the left. I think this requires outpatient surgery for hemorrhoidal ligation pexing hemorrhoidectomy. 2 sensitive to band anyway. He is interested in proceeding.  We'll work to coordinate a convenient time. Gave instructions. He agrees with the plan.  Current Plans ANOSCOPY, DIAGNOSTIC (22025)  INTERNAL BLEEDING HEMORRHOIDS (K64.8)  Current Plans Pt Education - Pamphlet Given - The Hemorrhoid Book: discussed with patient and provided information. Pt Education - [facility] Hemorrhoids (Kyleah P[place]): discussed with patient and provided information.  POLYP OF ANAL VERGE  (K62.0) Impression: Left lateral prolapsing anal verge polyp. Most likely anal crypt polyp. This would require removal to get rid of.   IRON DEFICIENCY ANEMIA DUE TO CHRONIC BLOOD LOSS (D50.0) Impression: History of intermittent iron deficiency anemia with no other source by upper and lower endoscopy. Bright red. Hopefully fixing the hemorrhoids will help break the cycle of intermittent GI bleeding and anemia.   PREDIABETES (R73.03)   ENCOUNTER FOR PREOPERATIVE EXAMINATION FOR GENERAL SURGICAL PROCEDURE (Z01.818)  Current Plans You are being scheduled for surgery- Our schedulers will call you.  You should hear from our office's scheduling department within 5 working days about the location, date, and time of surgery. We try to make accommodations for patient's preferences in scheduling surgery, but sometimes the OR schedule or the surgeon's schedule prevents [place] from making those accommodations.  If you have not heard from our office [phone]) in 5 working days, call the office and ask for your surgeon's nurse.  If you have other questions about your diagnosis, plan, or surgery, call the office and ask for your surgeon's nurse.  The anatomy & physiology of the anorectal region was discussed. The pathophysiology of hemorrhoids and differential diagnosis was discussed. Natural history risks without surgery was discussed. I stressed the importance of a bowel regimen to have daily soft bowel movements to minimize progression of disease. Interventions such as sclerotherapy & banding were discussed.  The patient's symptoms are not adequately controlled by medicines and other non-operative treatments. I feel the risks & problems of no surgery outweigh the operative risks; therefore, I recommended surgery to treat the hemorrhoids by ligation, pexy, and possible resection.  Risks such as bleeding, infection, urinary difficulties, need for further treatment, heart attack, death, and  other risks were discussed. I noted a good likelihood this will help address the problem. Goals of post-operative recovery were discussed as well. Possibility that this will not correct all symptoms was explained. Post-operative pain, bleeding, constipation, and other problems after surgery were discussed. We will work to minimize complications. Educational handouts further explaining the pathology, treatment options, and bowel regimen were given as well. Questions were answered. The patient expresses understanding & wishes to  proceed with surgery.  Pt Education - [facility] Rectal Prep for Anorectal outpatient/office surgery: discussed with patient and provided information. Pt Education - [facility] Rectal Surgery HCI (Shirl Ludington): discussed with patient and provided information.  DIASTASIS RECTI (M62.08)  Current Plans Pt Education - [facility] Diastasis Recti: discussed with patient and provided information.  UMBILICAL HERNIA WITHOUT OBSTRUCTION AND WITHOUT GANGRENE (K42.9) Impression: Very small umbilical hernia reducible. I am skeptical that this is the source of any abdominal pain.   ABDOMINAL PAIN, RIGHT LATERAL (R10.9) Impression: Intermittent right-sided lateral abdominal wall pain that seems somewhat positional and related to feeling constipated. Had an ultrasound last year that was underwhelming for gallstones and underwent CAT scan many years ago. Underwhelming upper and lower endoscopy this year Does not sound like any to severe concerning at this time. I will defer to gastrology. Hopefully a bowel regimen will help.  Ardeth Sportsman, MD, FACS, MASCRS  Esophageal, Gastrointestinal & Colorectal Surgery Robotic and Minimally Invasive Surgery [facility] [address], [address] [place], [place] [address] [phone] Fax [phone] Main/Paging  CONTACT INFORMATION: Weekday (9AM-5PM) concerns: Call [facility] main office at [phone] Weeknight (5PM-9AM) or Weekend/Holiday  concerns: Check [email or web address] for General Surgery [facility] coverage (Please, do not use SecureChat as it is not reliable communication to operating surgeons for immediate patient care)

## 2020-11-23 NOTE — Patient Instructions (Signed)
DUE TO COVID-19 ONLY ONE VISITOR IS ALLOWED TO COME WITH YOU AND STAY IN THE WAITING ROOM ONLY DURING PRE OP AND PROCEDURE DAY OF SURGERY. THE 1 VISITOR  MAY VISIT WITH YOU AFTER SURGERY IN YOUR PRIVATE ROOM DURING VISITING HOURS ONLY!                 Vegas Bieker     Your procedure is scheduled on: 12/06/20   Report to [facility] Main  Entrance   Report to admitting at 7:00 AM     Call this number if you have problems the morning of surgery [phone]    Remember: Do not eat food after Midnight.  Drink a G2 drink at 6:00 am   BRUSH YOUR TEETH MORNING OF SURGERY AND RINSE YOUR MOUTH OUT, NO CHEWING GUM CANDY OR MINTS.     Take these medicines the morning of surgery with A SIP OF WATER: Omeprazole  DO NOT TAKE ANY DIABETIC MEDICATIONS DAY OF YOUR SURGERY                               You may not have any metal on your body including              piercings  Do not wear jewelry,  lotions, powders or deodorant                       Men may shave face and neck.   Do not bring valuables to the hospital. [facility] IS NOT             RESPONSIBLE   FOR VALUABLES.  Contacts, dentures or bridgework may not be worn into surgery.       Patients discharged the day of surgery will not be allowed to drive home.   IF YOU ARE HAVING SURGERY AND GOING HOME THE SAME DAY, YOU MUST HAVE AN ADULT TO DRIVE YOU HOME AND BE WITH YOU FOR 24 HOURS.  YOU MAY GO HOME BY TAXI OR UBER OR ORTHERWISE, BUT AN ADULT MUST ACCOMPANY YOU HOME AND STAY WITH YOU FOR 24 HOURS.  Name and phone number of your driver:  Special Instructions: N/A              Please read over the following fact sheets you were given: _____________________________________________________________________             [facility] - Preparing for Surgery Before surgery, you can play an important role.  Because skin is not sterile, your skin needs to be as free of germs as possible.  You can reduce the number of germs on  your skin by washing with CHG (chlorahexidine gluconate) soap before surgery.  CHG is an antiseptic cleaner which kills germs and bonds with the skin to continue killing germs even after washing. Please DO NOT use if you have an allergy to CHG or antibacterial soaps.  If your skin becomes reddened/irritated stop using the CHG and inform your nurse when you arrive at Short Stay.   You may shave your face/neck. Please follow these instructions carefully:  1.  Shower with CHG Soap the night before surgery and the  morning of Surgery.  2.  If you choose to wash your hair, wash your hair first as usual with your  normal  shampoo.  3.  After you shampoo, rinse your hair and body thoroughly to remove the  shampoo.                                        4.  Use CHG as you would any other liquid soap.  You can apply chg directly  to the skin and wash                       Gently with a scrungie or clean washcloth.  5.  Apply the CHG Soap to your body ONLY FROM THE NECK DOWN.   Do not use on face/ open                           Wound or open sores. Avoid contact with eyes, ears mouth and genitals (private parts).                       Wash face,  Genitals (private parts) with your normal soap.             6.  Wash thoroughly, paying special attention to the area where your surgery  will be performed.  7.  Thoroughly rinse your body with warm water from the neck down.  8.  DO NOT shower/wash with your normal soap after using and rinsing off  the CHG Soap.             9.  Pat yourself dry with a clean towel.            10.  Wear clean pajamas.            11.  Place clean sheets on your bed the night of your first shower and do not  sleep with pets. Day of Surgery : Do not apply any lotions/deodorants the morning of surgery.  Please wear clean clothes to the hospital/surgery center.  FAILURE TO FOLLOW THESE INSTRUCTIONS MAY RESULT IN THE CANCELLATION OF YOUR SURGERY PATIENT  SIGNATURE_________________________________  NURSE SIGNATURE__________________________________  ________________________________________________________________________

## 2020-11-26 ENCOUNTER — Other Ambulatory Visit: Payer: Self-pay

## 2020-11-26 ENCOUNTER — Encounter (HOSPITAL_COMMUNITY): Payer: Self-pay

## 2020-11-26 ENCOUNTER — Encounter (HOSPITAL_COMMUNITY)
Admission: RE | Admit: 2020-11-26 | Discharge: 2020-11-26 | Disposition: A | Discharge status: Home / Self Care | Payer: 59 | Source: Ambulatory Visit | Attending: Surgery | Admitting: Surgery

## 2020-11-26 DIAGNOSIS — Z01812 Encounter for preprocedural laboratory examination: Secondary | ICD-10-CM | POA: Insufficient documentation

## 2020-11-26 LAB — GLUCOSE, CAPILLARY: Glucose-Capillary: 119 mg/dL — ABNORMAL HIGH (ref 70–99)

## 2020-11-26 LAB — CBC
HCT: 45 % (ref 39.0–52.0)
Hemoglobin: 14 g/dL (ref 13.0–17.0)
MCH: 23.6 pg — ABNORMAL LOW (ref 26.0–34.0)
MCHC: 31.1 g/dL (ref 30.0–36.0)
MCV: 75.9 fL — ABNORMAL LOW (ref 80.0–100.0)
Platelets: 160 10*3/uL (ref 150–400)
RBC: 5.93 MIL/uL — ABNORMAL HIGH (ref 4.22–5.81)
RDW: 16.8 % — ABNORMAL HIGH (ref 11.5–15.5)
WBC: 4.3 10*3/uL (ref 4.0–10.5)
nRBC: 0 % (ref 0.0–0.2)

## 2020-11-26 LAB — BASIC METABOLIC PANEL
Anion gap: 8 (ref 5–15)
BUN: 15 mg/dL (ref 6–20)
CO2: 27 mmol/L (ref 22–32)
Calcium: 9.4 mg/dL (ref 8.9–10.3)
Chloride: 103 mmol/L (ref 98–111)
Creatinine, Ser: 0.99 mg/dL (ref 0.61–1.24)
GFR, Estimated: >60 mL/min (ref >60)
Glucose, Bld: 101 mg/dL — ABNORMAL HIGH (ref 70–99)
Potassium: 4 mmol/L (ref 3.5–5.1)
Sodium: 138 mmol/L (ref 135–145)

## 2020-11-26 LAB — HEMOGLOBIN A1C
Hgb A1c MFr Bld: 5.9 % — ABNORMAL HIGH (ref 4.8–5.6)
Mean Plasma Glucose: 122.63 mg/dL

## 2020-11-26 NOTE — Progress Notes (Signed)
COVID Vaccine Completed:No Date COVID Vaccine completed: COVID vaccine manufacturer: Pfizer    Moderna   [employer]   PCP - Dr. Venita Lick Cardiologist - no  Chest x-ray -  EKG - no Stress Test - no ECHO - no Cardiac Cath - no Pacemaker/ICD device last checked:NA  Sleep Study - no CPAP -   Fasting Blood Sugar - NA Checks Blood Sugar _____ times a day  Blood Thinner Instructions:NA Aspirin Instructions: Last Dose:  Anesthesia review: no  Patient denies shortness of breath, fever, cough and chest pain at PAT appointment Pt is active and has no SOB with any activities. He has lost weight and doesn't take any medications. He is nervous about anesthesia and wants to talk to the Dr. before signing the consent. He has been bleeding from his hemorrhoids.  Patient verbalized understanding of instructions that were given to them at the PAT appointment. Patient was also instructed that they will need to review over the PAT instructions again at home before surgery. yes

## 2020-12-05 MED ORDER — BUPIVACAINE LIPOSOME 1.3 % IJ SUSP
20.0000 mL | Freq: Once | INTRAMUSCULAR | Status: DC
  Filled 2020-12-05: qty 20

## 2020-12-06 ENCOUNTER — Ambulatory Visit (HOSPITAL_COMMUNITY): Admission: RE | Admit: 2020-12-06 | Payer: 59 | Source: Home / Self Care | Admitting: Surgery

## 2020-12-06 ENCOUNTER — Encounter (HOSPITAL_COMMUNITY): Admission: RE | Payer: Self-pay | Source: Home / Self Care

## 2020-12-06 ENCOUNTER — Encounter (HOSPITAL_COMMUNITY): Payer: Self-pay | Admitting: Anesthesiology

## 2020-12-06 SURGERY — HEMORRHOIDECTOMY PROLAPSED
Anesthesia: General

## 2020-12-06 MED ORDER — BUPIVACAINE-EPINEPHRINE (PF) 0.5% -1:200000 IJ SOLN
INTRAMUSCULAR | Status: AC
  Filled 2020-12-06: qty 30

## 2020-12-06 MED ORDER — LIDOCAINE 2% (20 MG/ML) 5 ML SYRINGE
INTRAMUSCULAR | Status: AC
  Filled 2020-12-06: qty 5

## 2020-12-06 MED ORDER — PROPOFOL 10 MG/ML IV BOLUS
INTRAVENOUS | Status: AC
  Filled 2020-12-06: qty 20

## 2020-12-06 MED ORDER — ROCURONIUM BROMIDE 10 MG/ML (PF) SYRINGE
PREFILLED_SYRINGE | INTRAVENOUS | Status: AC
  Filled 2020-12-06: qty 10

## 2020-12-06 NOTE — Anesthesia Preprocedure Evaluation (Deleted)
Anesthesia Evaluation    Reviewed: Allergy & Precautions, Patient's Chart, lab work & pertinent test results  History of Anesthesia Complications Negative for: history of anesthetic complications  Airway        Dental   Pulmonary neg pulmonary ROS,           Cardiovascular negative cardio ROS       Neuro/Psych negative neurological ROS  negative psych ROS   GI/Hepatic Neg liver ROS, GERD  ,  Endo/Other  negative endocrine ROS  Renal/GU negative Renal ROS     Musculoskeletal negative musculoskeletal ROS (+)   Abdominal   Peds  Hematology negative hematology ROS (+)   Anesthesia Other Findings   Reproductive/Obstetrics                             Anesthesia Physical Anesthesia Plan  ASA: 1  Anesthesia Plan: General   Post-op Pain Management:    Induction: Intravenous  PONV Risk Score and Plan: 2 and Treatment may vary due to age or medical condition, Ondansetron, Dexamethasone and Midazolam  Airway Management Planned: Oral ETT  Additional Equipment: None  Intra-op Plan:   Post-operative Plan: Extubation in OR  Informed Consent:   Plan Discussed with: CRNA and Anesthesiologist  Anesthesia Plan Comments:         Anesthesia Quick Evaluation

## 2021-09-19 ENCOUNTER — Emergency Department (HOSPITAL_BASED_OUTPATIENT_CLINIC_OR_DEPARTMENT_OTHER)
Admission: EM | Admit: 2021-09-19 | Discharge: 2021-09-19 | Disposition: A | Discharge status: Home / Self Care | Payer: 59 | Attending: Emergency Medicine | Admitting: Emergency Medicine

## 2021-09-19 ENCOUNTER — Emergency Department (HOSPITAL_BASED_OUTPATIENT_CLINIC_OR_DEPARTMENT_OTHER): Payer: 59

## 2021-09-19 ENCOUNTER — Encounter (HOSPITAL_BASED_OUTPATIENT_CLINIC_OR_DEPARTMENT_OTHER): Payer: Self-pay | Admitting: Emergency Medicine

## 2021-09-19 ENCOUNTER — Other Ambulatory Visit: Payer: Self-pay

## 2021-09-19 DIAGNOSIS — R350 Frequency of micturition: Secondary | ICD-10-CM | POA: Insufficient documentation

## 2021-09-19 DIAGNOSIS — I10 Essential (primary) hypertension: Secondary | ICD-10-CM | POA: Diagnosis not present

## 2021-09-19 DIAGNOSIS — R300 Dysuria: Secondary | ICD-10-CM | POA: Diagnosis not present

## 2021-09-19 DIAGNOSIS — R109 Unspecified abdominal pain: Secondary | ICD-10-CM | POA: Insufficient documentation

## 2021-09-19 LAB — URINALYSIS, ROUTINE W REFLEX MICROSCOPIC
Bilirubin Urine: NEGATIVE
Glucose, UA: NEGATIVE mg/dL
Hgb urine dipstick: NEGATIVE
Ketones, ur: NEGATIVE mg/dL
Leukocytes,Ua: NEGATIVE
Nitrite: NEGATIVE
Protein, ur: NEGATIVE mg/dL
Specific Gravity, Urine: 1.025 (ref 1.005–1.030)
pH: 6 (ref 5.0–8.0)

## 2021-09-19 NOTE — ED Notes (Signed)
ED Provider at bedside. 

## 2021-09-19 NOTE — Discharge Instructions (Addendum)
Please bring a copy of your urinalysis and your CT scan report to the urology office with you.  It is possible that you are experiencing interstitial cystitis.  I do not see signs of an infection on your work-up today.  You should discuss this with the urologist.

## 2021-09-19 NOTE — ED Provider Notes (Signed)
[facility] EMERGENCY DEPARTMENT Provider Note   CSN: [id number] Arrival date & time: 09/19/21  5631     History  Chief Complaint  Patient presents with   Dysuria    Kurt Russell is a 46 y.o. male presenting to the emergency department with urinary urgency and frequency.  He reports this been ongoing for approximately 1 month.  He has a urology appointment in 1 week.  He came to the ED today because he is having difficulty sleeping at night, feeling he is run to the bathroom all the time.  He drinks a moderate amount of water, does not have excessive thirst.  He was told he was "prediabetic in the past".  He reports he has had some mild dysuria, warmth and mild burning sensation near the end of his penis with urination the past several times.  His doctor started him on Azo, which she has taken 3 doses of.  He reports that at the time of the onset of his symptoms, he had been drinking a supplement juice, which was new, designed for sexual stimulation.  This includes multiple herbal supplements.  He says he immediately stopped drinking this medication after he developed the symptoms.  He reports he is in a monogamous relationship with his wife, and has no concern whatsoever of sexually transmitted infection, does not want to be tested for these at this time  HPI     Home Medications Prior to Admission medications   Medication Sig Start Date End Date Taking? Authorizing Provider  AZO-CRANBERRY PO Take by mouth.   Yes [provider]  NON FORMULARY Take 30 mLs by mouth 2 (two) times daily. Natural Men Tonic  [email or web address]   Yes [provider]  Cholecalciferol (VITAMIN D) 50 MCG (2000 UT) CAPS 1 capsule    [provider]  Multiple Vitamin (MULTIVITAMIN ADULT PO) Multivitamin w/ Iron & Minerals    [provider]      Allergies    Patient has no known allergies.    Review of Systems   Review of Systems  Physical  Exam Updated Vital Signs BP (!) 146/99   Pulse 67   Temp 98.6 F (37 C) (Oral)   Resp 16   Ht 6' (1.829 m)   Wt 98.4 kg   SpO2 100%   BMI 29.42 kg/m  Physical Exam Constitutional:      General: He is not in acute distress. HENT:     Head: Normocephalic and atraumatic.  Eyes:     Conjunctiva/sclera: Conjunctivae normal.     Pupils: Pupils are equal, round, and reactive to light.  Cardiovascular:     Rate and Rhythm: Normal rate and regular rhythm.  Pulmonary:     Effort: Pulmonary effort is normal. No respiratory distress.  Abdominal:     General: There is no distension.     Tenderness: There is no abdominal tenderness.  Skin:    General: Skin is warm and dry.  Neurological:     General: No focal deficit present.     Mental Status: He is alert. Mental status is at baseline.  Psychiatric:        Mood and Affect: Mood normal.        Behavior: Behavior normal.     ED Results / Procedures / Treatments   Labs (all labs ordered are listed, but only abnormal results are displayed) Labs Reviewed  URINALYSIS, ROUTINE W REFLEX MICROSCOPIC    EKG None  Radiology CT Renal Larina Bras  Study  Result Date: 09/19/2021 CLINICAL DATA:  Right-sided flank pain, kidney stones suspected, dysuria for 1 month EXAM: CT ABDOMEN AND PELVIS WITHOUT CONTRAST TECHNIQUE: Multidetector CT imaging of the abdomen and pelvis was performed following the standard protocol without IV contrast. RADIATION DOSE REDUCTION: This exam was performed according to the departmental dose-optimization program which includes automated exposure control, adjustment of the mA and/or kV according to patient size and/or use of iterative reconstruction technique. COMPARISON:  12/23/2012 FINDINGS: Lower chest: No acute abnormality. Mild, bandlike scarring of the bilateral lung bases. Hepatobiliary: No solid liver abnormality is seen. No gallstones, gallbladder wall thickening, or biliary dilatation. Pancreas: Unremarkable. No  pancreatic ductal dilatation or surrounding inflammatory changes. Spleen: Normal in size without significant abnormality. Adrenals/Urinary Tract: Adrenal glands are unremarkable. Kidneys are normal, without renal calculi, solid lesion, or hydronephrosis. Thickening of the urinary bladder wall (series 6, image 71). Stomach/Bowel: Stomach is within normal limits. Appendix appears normal. No evidence of bowel wall thickening, distention, or inflammatory changes. Vascular/Lymphatic: No significant vascular findings are present. No enlarged abdominal or pelvic lymph nodes. Reproductive: No mass or other significant abnormality. Other: No abdominal wall hernia or abnormality. No ascites. Musculoskeletal: No acute or significant osseous findings. IMPRESSION: 1. No urinary tract calculi or hydronephrosis. 2. Thickening of the urinary bladder wall, suggestive of nonspecific infectious or inflammatory cystitis. Electronically Signed   By: Jearld Lesch M.D.   On: 09/19/2021 09:36    Procedures Procedures    Medications Ordered in ED Medications - No data to display  ED Course/ Medical Decision Making/ A&P Clinical Course as of 09/19/21 1057  Thu Sep 19, 2021  0948 Bladder wall thickening could be consistent with cystitis, possible interstitial cystitis.  He has urology follow-up next week for further evaluation. [MT]    Clinical Course User Index [MT] Kerri Kovacik, Kermit Balo, MD                           Medical Decision Making Amount and/or Complexity of Data Reviewed Labs: ordered. Radiology: ordered.   Patient is here with dysuria ongoing for a month.  Differential diagnosis would include UTI versus bladder spasms versus prostate problems versus ureteral colic or kidney stone.  UA personally examined and interpreted, no evidence of infection, no sign of ketones or dehydration, no evidence of glucose in the urine.  Overall he does not show signs or symptoms of new onset diabetes, I do not believe we need  further blood work in the emergency department from this perspective.  CT renal study ordered and personally reviewed and interpreted, showing no kidney stone but there are no bladder wall thickening  We did discuss high blood pressure in the ER today.  He reports that he has whitecoat hypertension.  He does see the doctor regularly and they are monitoring his blood pressure, typically it is well controlled.  I think he is reasonably safe and stable for follow-up with a urologist as an outpatient.  I recommend he discontinue the Azo and any additional supplements of these may be causing more symptoms and not helping him at all.        Final Clinical Impression(s) / ED Diagnoses Final diagnoses:  Urinary frequency    Rx / DC Orders ED Discharge Orders     None         Terald Sleeper, MD 09/19/21 1057

## 2021-09-19 NOTE — ED Notes (Signed)
Patient transported to CT 

## 2021-09-19 NOTE — ED Triage Notes (Signed)
Pt states he has been having some dysuria x 1 month.  Saw PCP last week and had U/A done, was told no infection.  Pt has some burning with urination and has pressure after voiding.  No known fevers.  No penile discharge.

## 2021-09-24 ENCOUNTER — Encounter: Payer: Self-pay | Admitting: Urology

## 2021-09-24 ENCOUNTER — Ambulatory Visit: Payer: 59 | Admitting: Urology

## 2021-09-24 VITALS — BP 166/92 | HR 84 | Ht 72.0 in | Wt 215.0 lb

## 2021-09-24 DIAGNOSIS — Z125 Encounter for screening for malignant neoplasm of prostate: Secondary | ICD-10-CM | POA: Diagnosis not present

## 2021-09-24 DIAGNOSIS — R350 Frequency of micturition: Secondary | ICD-10-CM | POA: Diagnosis not present

## 2021-09-24 DIAGNOSIS — R300 Dysuria: Secondary | ICD-10-CM

## 2021-09-24 DIAGNOSIS — N401 Enlarged prostate with lower urinary tract symptoms: Secondary | ICD-10-CM | POA: Diagnosis not present

## 2021-09-24 DIAGNOSIS — R37 Sexual dysfunction, unspecified: Secondary | ICD-10-CM

## 2021-09-24 LAB — URINALYSIS, COMPLETE
Bilirubin, UA: NEGATIVE
Glucose, UA: NEGATIVE
Ketones, UA: NEGATIVE
Leukocytes,UA: NEGATIVE
Nitrite, UA: NEGATIVE
Protein,UA: NEGATIVE
RBC, UA: NEGATIVE
Specific Gravity, UA: 1.03 (ref 1.005–1.030)
Urobilinogen, Ur: 0.2 mg/dL (ref 0.2–1.0)
pH, UA: 5.5 (ref 5.0–7.5)

## 2021-09-24 LAB — MICROSCOPIC EXAMINATION: Bacteria, UA: NONE SEEN

## 2021-09-24 LAB — BLADDER SCAN AMB NON-IMAGING: Scan Result: 0

## 2021-09-24 MED ORDER — TAMSULOSIN HCL 0.4 MG PO CAPS: 0.4000 mg | ORAL_CAPSULE | Freq: Every day | ORAL | 11 refills | Status: DC

## 2021-09-24 NOTE — Patient Instructions (Signed)

## 2021-09-24 NOTE — Progress Notes (Signed)
09/24/21 8:47 AM   Kurt Russell [birth year] [id number]  Referring provider:  Daisy Floro, MD [address] [place],  [place] [address] Chief Complaint  Patient presents with   Dysuria      HPI: Kurt Russell is a 46 y.o.male who presents today for further evaluation of dysuria.   He was seen in the ER on 09/19/2021. He presented with urinary urgency and frequency that had been ongoing for approximately 1 month. Urine was negative.He underwent a CT renal stone study and visualized no urinary tract calculi or hydronephrosis. It showed thickening of the urinary bladder wall.    He reports urinary urgency and frequency today that he has issues with his sexual life. He reports that he was not sexually active at first but when he started back he has a hard time with ejaculation and shrinking of his penis associated with weight gain.  He does feel that he is able to maintain his erections and has fully intact libido..   In terms of urinary issues, this for started about 2 months ago.  He reports that he started on a supplement, a liquid concoction of many vitamins and herbs which he brought with him today which he bought an [place] shop, product produced in [place].  Shortly after beginning this product, he started to have urgency, frequency, and lower abdominal discomfort in his bladder.  He stopped using this product about a month ago and continued to have issues.  He did have some improvement briefly over the past week after acupuncture but over the weekend, his symptoms recurred.  He is also been having some intermittent dysuria.  No gross hematuria.  He reports that [place] is from [place] last traveled to [place] in [place].   IPSS score 19 as below. PVR of 0 ml today. UA benign.   IPSS     Row Name 09/24/21 0800         International Prostate Symptom Score   How often have you had the sensation of not emptying your bladder? More than half the time     How often have you  had to urinate less than every two hours? More than half the time     How often have you found you stopped and started again several times when you urinated? Less than half the time     How often have you found it difficult to postpone urination? About half the time     How often have you had a weak urinary stream? About half the time     How often have you had to strain to start urination? Less than half the time     How many times did you typically get up at night to urinate? 1 Time     Total IPSS Score 19       Quality of Life due to urinary symptoms   If you were to spend the rest of your life with your urinary condition just the way it is now how would you feel about that? Unhappy              Score:  1-7 Mild 8-19 Moderate 20-35 Severe    PMH: Past Medical History:  Diagnosis Date   Anemia    GERD (gastroesophageal reflux disease)    Hyperlipidemia     Surgical History: Past Surgical History:  Procedure Laterality Date   COLONOSCOPY     VASECTOMY      Home Medications:  Allergies  as of 09/24/2021   No Known Allergies      Medication List        Accurate as of September 24, 2021  8:47 AM. If you have any questions, ask your nurse or doctor.          AZO Men Bladder Control 500 MG Caps Generic drug: Pumpkin Seed See admin instructions.   AZO-CRANBERRY PO Take by mouth.   ibuprofen 800 MG tablet Commonly known as: ADVIL Take 800 mg by mouth every 8 (eight) hours as needed.   MULTIVITAMIN ADULT PO Multivitamin w/ Iron & Minerals   NON FORMULARY Take 30 mLs by mouth 2 (two) times daily. Natural Men Tonic  [email or web address]   Vitamin D 50 MCG (2000 UT) Caps 1 capsule        Allergies:  No Known Allergies  Family History: No family history on file.  Social History:  reports that he has never smoked. He has never used smokeless tobacco. He reports that he does not drink alcohol and does not use drugs.   Physical Exam: BP (!)  166/92   Pulse 84   Ht 6' (1.829 m)   Wt 215 lb (97.5 kg)   BMI 29.16 kg/m   Constitutional:  Alert and oriented, No acute distress. HEENT: [place] AT, moist mucus membranes.  Trachea midline, no masses. Cardiovascular: No clubbing, cyanosis, or edema. Respiratory: Normal respiratory effort, no increased work of breathing. Rectal: Normal sphincter tone,  40  CC prostate, smooth no nodules Skin: No rashes, bruises or suspicious lesions. Neurologic: Grossly intact, no focal deficits, moving all 4 extremities. Psychiatric: Normal mood and affect.  Laboratory Data:  Lab Results  Component Value Date   CREATININE 0.99 11/26/2020   Lab Results  Component Value Date   HGBA1C 5.9 (H) 11/26/2020    Urinalysis Benign   Pertinent Imaging: CLINICAL DATA:  Right-sided flank pain, kidney stones suspected, dysuria for 1 month   EXAM: CT ABDOMEN AND PELVIS WITHOUT CONTRAST   TECHNIQUE: Multidetector CT imaging of the abdomen and pelvis was performed following the standard protocol without IV contrast.   RADIATION DOSE REDUCTION: This exam was performed according to the departmental dose-optimization program which includes automated exposure control, adjustment of the mA and/or kV according to patient size and/or use of iterative reconstruction technique.   COMPARISON:  12/23/2012   FINDINGS: Lower chest: No acute abnormality. Mild, bandlike scarring of the bilateral lung bases.   Hepatobiliary: No solid liver abnormality is seen. No gallstones, gallbladder wall thickening, or biliary dilatation.   Pancreas: Unremarkable. No pancreatic ductal dilatation or surrounding inflammatory changes.   Spleen: Normal in size without significant abnormality.   Adrenals/Urinary Tract: Adrenal glands are unremarkable. Kidneys are normal, without renal calculi, solid lesion, or hydronephrosis. Thickening of the urinary bladder wall (series 6, image 71).   Stomach/Bowel: Stomach is within  normal limits. Appendix appears normal. No evidence of bowel wall thickening, distention, or inflammatory changes.   Vascular/Lymphatic: No significant vascular findings are present. No enlarged abdominal or pelvic lymph nodes.   Reproductive: No mass or other significant abnormality.   Other: No abdominal wall hernia or abnormality. No ascites.   Musculoskeletal: No acute or significant osseous findings.   IMPRESSION: 1. No urinary tract calculi or hydronephrosis. 2. Thickening of the urinary bladder wall, suggestive of nonspecific infectious or inflammatory cystitis.     Electronically Signed   By: Jearld LeschAlex D Bibbey M.D.   On: 09/19/2021 09:36  I have personally reviewed the images and  agree with radiologist interpretation.    Results for orders placed or performed in visit on 09/24/21  Bladder Scan (Post Void Residual) in office  Result Value Ref Range   Scan Result 0     Assessment & Plan:   BPH with urinary frequency and dysuria  - PVR 0 ml today  - UA benign.  - Recommend further evaluation of prostate anatomy and bladder health to rule out any underlying pathology such as prostatitis versus schistosomiasis or malignancy.  - In the interim will treat his symptoms with Flomax. We dicussed common side effects including retrograde ejaculation - Will send urine for cytology   2. Prostate cancer screening  - PSA pending  - DRE benign   3.  Sexual dysfunction -Primarily behavioral rather than true erectile dysfunction -No signs or symptoms consistent with hypogonadism   F/U cysto  I,Kailey Littlejohn,acting as a scribe for Vanna Scotland, MD.,have documented all relevant documentation on the behalf of Vanna Scotland, MD,as directed by  Vanna Scotland, MD while in the presence of Vanna Scotland, MD.  [facility] [address] [place], [place] [address] [phone]

## 2021-09-25 LAB — PSA: Prostate Specific Ag, Serum: 0.5 ng/mL (ref 0.0–4.0)

## 2021-10-22 ENCOUNTER — Ambulatory Visit: Payer: 59 | Admitting: Urology

## 2021-10-22 VITALS — BP 135/89 | HR 79 | Ht 72.0 in | Wt 213.0 lb

## 2021-10-22 DIAGNOSIS — R350 Frequency of micturition: Secondary | ICD-10-CM | POA: Diagnosis not present

## 2021-10-22 DIAGNOSIS — N401 Enlarged prostate with lower urinary tract symptoms: Secondary | ICD-10-CM | POA: Diagnosis not present

## 2021-10-22 DIAGNOSIS — R300 Dysuria: Secondary | ICD-10-CM | POA: Diagnosis not present

## 2021-10-22 LAB — URINALYSIS, COMPLETE
Bilirubin, UA: NEGATIVE
Glucose, UA: NEGATIVE
Ketones, UA: NEGATIVE
Leukocytes,UA: NEGATIVE
Nitrite, UA: NEGATIVE
Protein,UA: NEGATIVE
RBC, UA: NEGATIVE
Specific Gravity, UA: 1.025 (ref 1.005–1.030)
Urobilinogen, Ur: 0.2 mg/dL (ref 0.2–1.0)
pH, UA: 5.5 (ref 5.0–7.5)

## 2021-10-22 NOTE — Progress Notes (Signed)
   10/22/21 CC:  Chief Complaint  Patient presents with   Cysto    HPI: Kurt Russell is a 46 y.o. male with a personal history of, who presents today for a cystoscopy with urine cytology.   He underwent a CT renal stone on 09/19/2021 during hospital encounter that visualized no urinary tract calculi or hydronephrosis. It showed thickening of the urinary bladder wall.   His urinary symptoms were treated with Flomax in the interim.  He continues to improve.  He has a good stream now.  His dysuria is almost completely resolved.  His most recent PSA on 09/24/2021 was 0.5.  Vitals:   10/22/21 1023  BP: 135/89  Pulse: 79   NED. A&Ox3.   No respiratory distress   Abd soft, NT, ND Normal phallus with bilateral descended testicles  Cystoscopy Procedure Note  Patient identification was confirmed, informed consent was obtained, and patient was prepped using Betadine solution.  Lidocaine jelly was administered per urethral meatus.     Pre-Procedure: - Inspection reveals a normal caliber ureteral meatus.  Procedure: The flexible cystoscope was introduced without difficulty - No urethral strictures/lesions are present. 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    F/u prn  Tawni Millers as a scribe for Vanna Scotland, MD.,have documented all relevant documentation on  the behalf of Vanna Scotland, MD,as directed by  Vanna Scotland, MD while in the presence of Vanna Scotland, MD.  I have reviewed the above documentation for accuracy and completeness, and I agree with the above.   Vanna Scotland, MD

## 2021-10-23 LAB — CYTOLOGY - NON PAP

## 2022-06-25 DIAGNOSIS — E291 Testicular hypofunction: Secondary | ICD-10-CM | POA: Diagnosis not present

## 2022-06-25 DIAGNOSIS — Z0000 Encounter for general adult medical examination without abnormal findings: Secondary | ICD-10-CM | POA: Diagnosis not present

## 2022-06-30 DIAGNOSIS — Z6829 Body mass index (BMI) 29.0-29.9, adult: Secondary | ICD-10-CM | POA: Diagnosis not present

## 2022-06-30 DIAGNOSIS — R7303 Prediabetes: Secondary | ICD-10-CM | POA: Diagnosis not present

## 2022-06-30 DIAGNOSIS — E291 Testicular hypofunction: Secondary | ICD-10-CM | POA: Diagnosis not present

## 2022-06-30 DIAGNOSIS — Z79899 Other long term (current) drug therapy: Secondary | ICD-10-CM | POA: Diagnosis not present

## 2022-06-30 DIAGNOSIS — N529 Male erectile dysfunction, unspecified: Secondary | ICD-10-CM | POA: Diagnosis not present

## 2022-08-25 IMAGING — CT CT RENAL STONE PROTOCOL
2 of 4 series · 16 of 46 positions shown, 18 images · non-contrast
Comparison: 12/23/2012

CLINICAL DATA: Right-sided flank pain, kidney stones suspected,
dysuria for 1 month



[Series 2: axial st · axial · 0.96mm/px · z∈[-648,-148]mm · 13 of 110 slices shown, 15 images]
[im 5/110  soft-tissue]
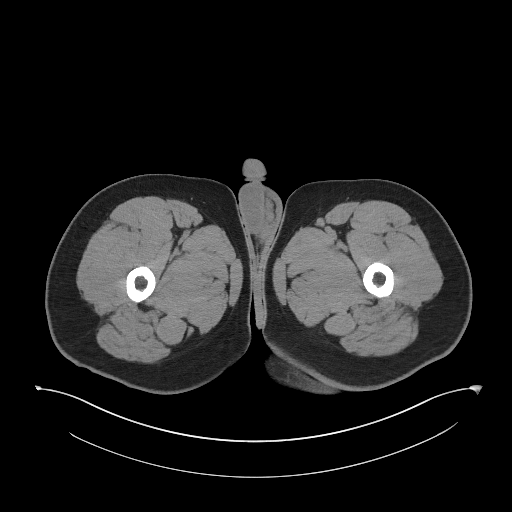
[im 5/110  bone]
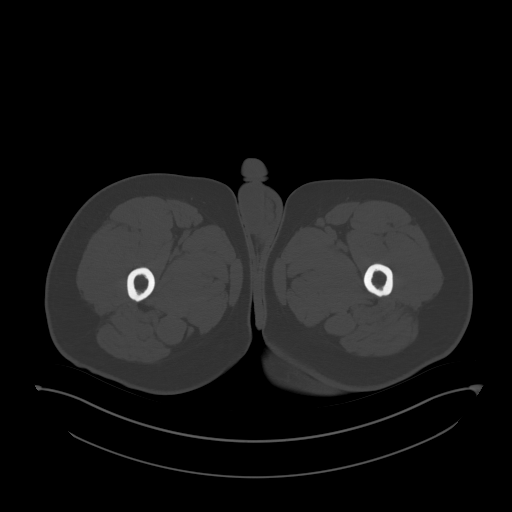
[im 14/110  soft-tissue]
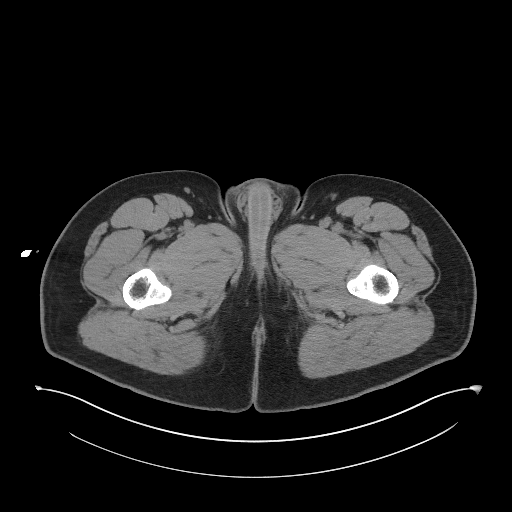
[im 23/110  soft-tissue]
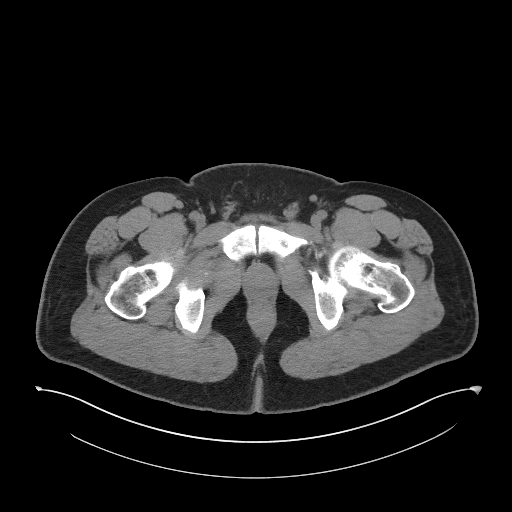
[im 32/110  soft-tissue]
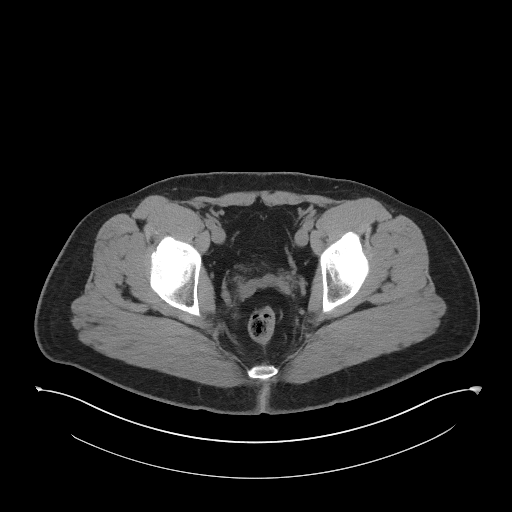
[im 37/110  soft-tissue]
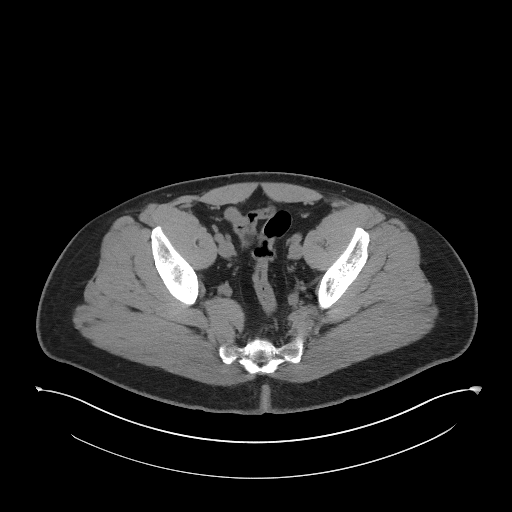
[im 46/110  soft-tissue]
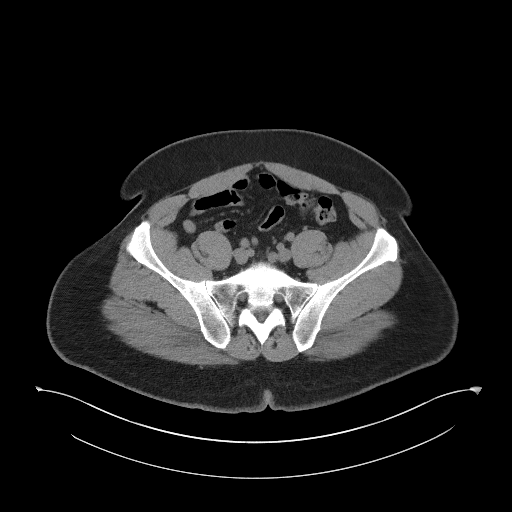
[im 55/110  soft-tissue]
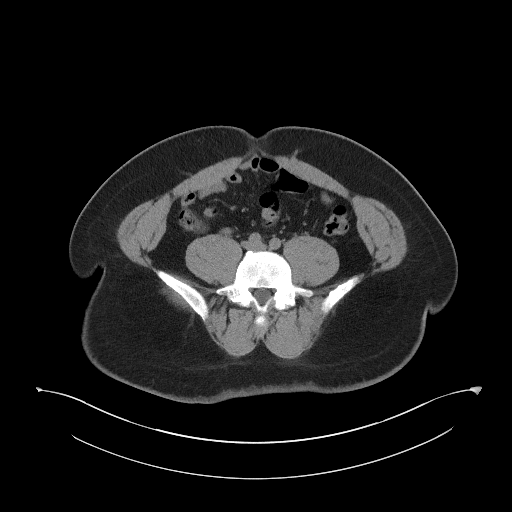
[im 64/110  soft-tissue]
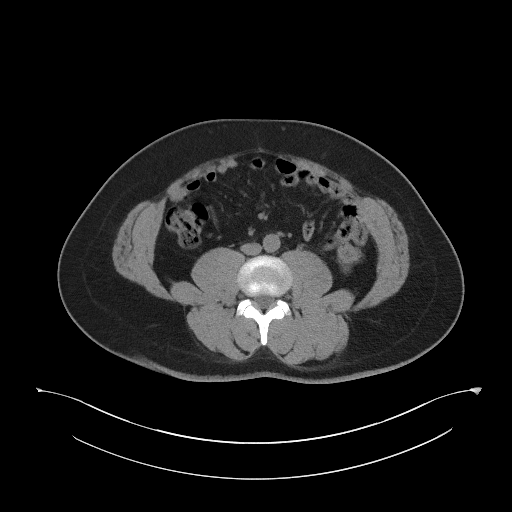
[im 73/110  soft-tissue]
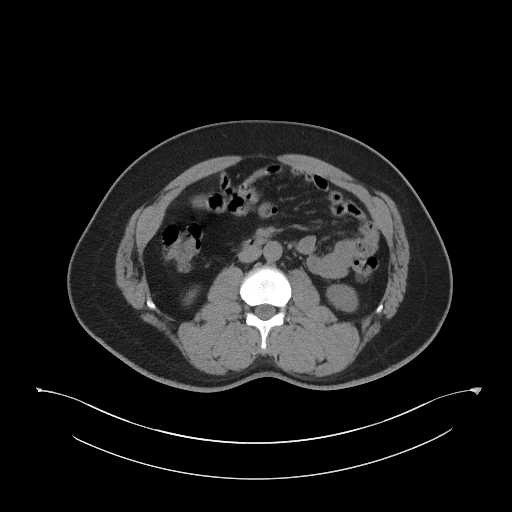
[im 73/110  bone]
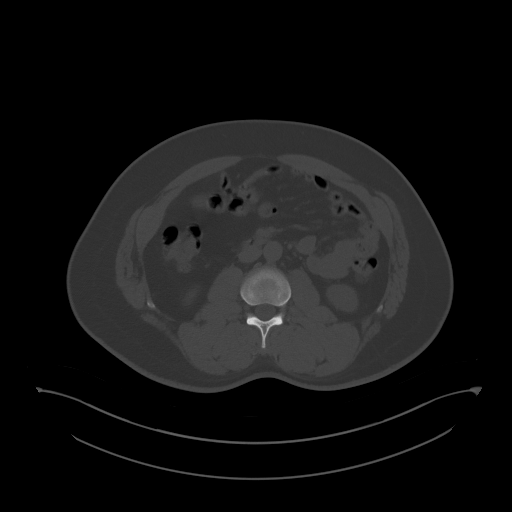
[im 78/110  soft-tissue]
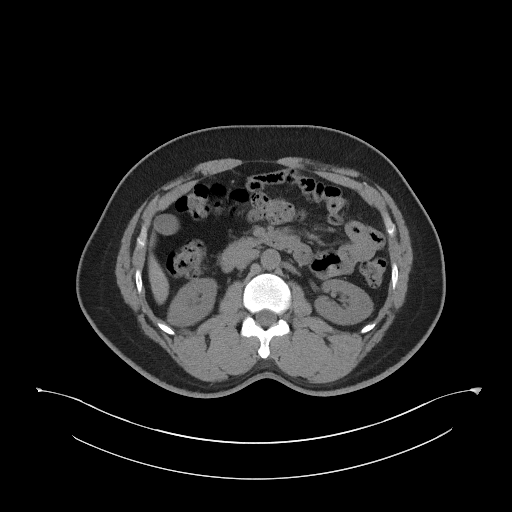
[im 87/110  soft-tissue]
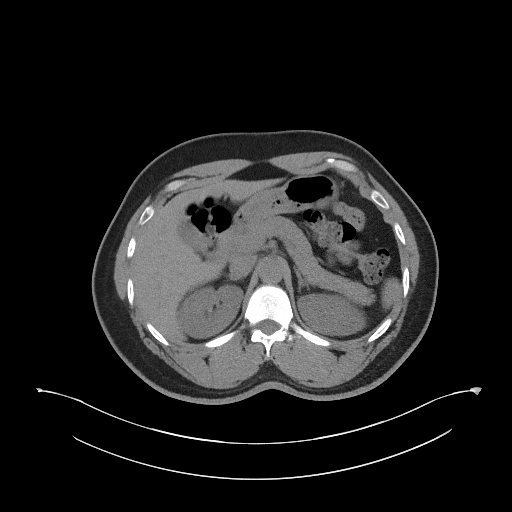
[im 96/110  soft-tissue]
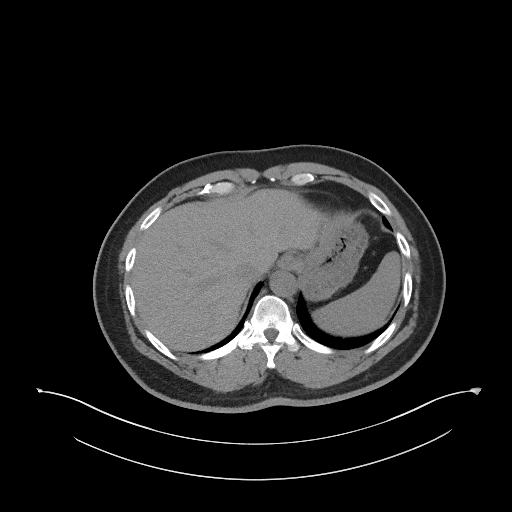
[im 105/110  soft-tissue]
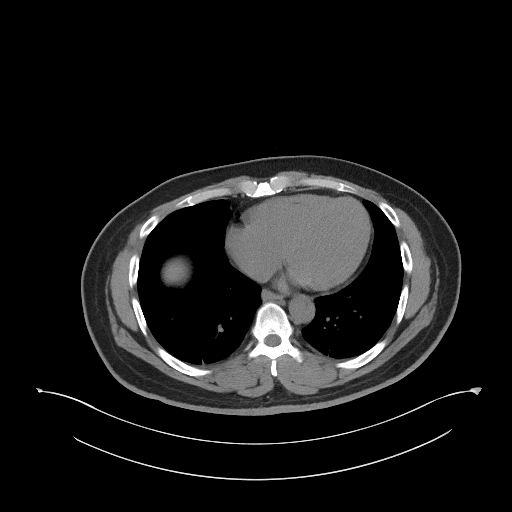

[Series 5: coronal st · coronal · 0.81mm/px · 3 of 101 slices shown]
[im 34/101  soft-tissue]
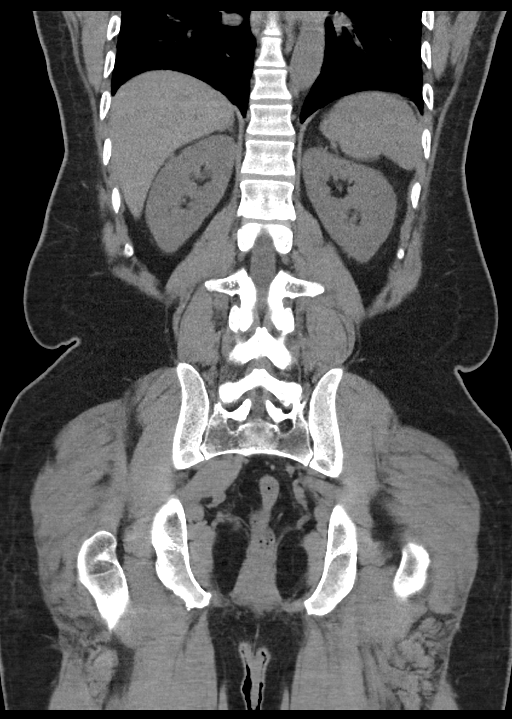
[im 45/101  soft-tissue]
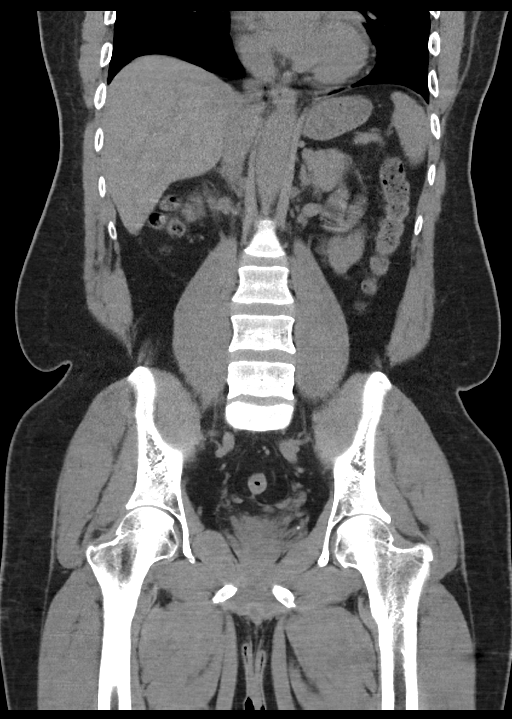
[im 56/101  soft-tissue]
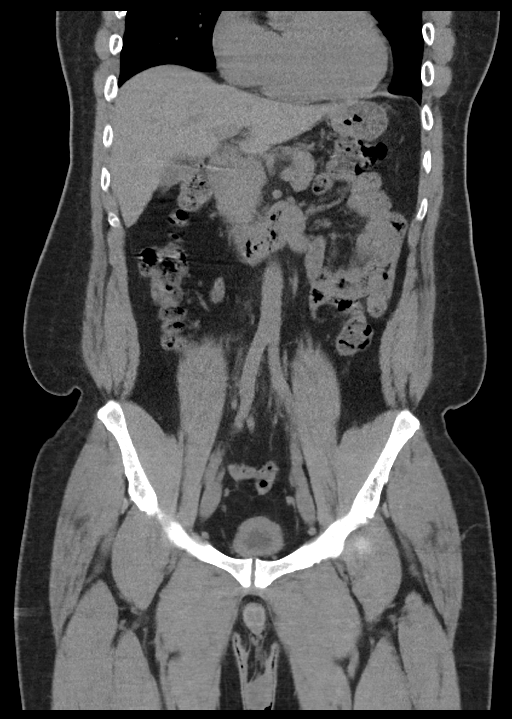

[16 of 46 positions shown; findings below may reference images not displayed]

FINDINGS: Lower chest: No acute abnormality. Mild, bandlike scarring of the
bilateral lung bases.

Hepatobiliary: No solid liver abnormality is seen. No gallstones,
gallbladder wall thickening, or biliary dilatation.

Pancreas: Unremarkable. No pancreatic ductal dilatation or
surrounding inflammatory changes.

Spleen: Normal in size without significant abnormality.

Adrenals/Urinary Tract: Adrenal glands are unremarkable. Kidneys are
normal, without renal calculi, solid lesion, or hydronephrosis.
Thickening of the urinary bladder wall (series 6, image 71).

Stomach/Bowel: Stomach is within normal limits. Appendix appears
normal. No evidence of bowel wall thickening, distention, or
inflammatory changes.

Vascular/Lymphatic: No significant vascular findings are present. No
enlarged abdominal or pelvic lymph nodes.

Reproductive: No mass or other significant abnormality.

Other: No abdominal wall hernia or abnormality. No ascites.

Musculoskeletal: No acute or significant osseous findings.
IMPRESSION: 1. No urinary tract calculi or hydronephrosis.
2. Thickening of the urinary bladder wall, suggestive of nonspecific
infectious or inflammatory cystitis.

## 2022-09-24 DIAGNOSIS — Z6828 Body mass index (BMI) 28.0-28.9, adult: Secondary | ICD-10-CM | POA: Diagnosis not present

## 2022-09-24 DIAGNOSIS — E559 Vitamin D deficiency, unspecified: Secondary | ICD-10-CM | POA: Diagnosis not present

## 2022-09-24 DIAGNOSIS — R5383 Other fatigue: Secondary | ICD-10-CM | POA: Diagnosis not present

## 2022-09-24 DIAGNOSIS — R252 Cramp and spasm: Secondary | ICD-10-CM | POA: Diagnosis not present

## 2022-09-24 DIAGNOSIS — R42 Dizziness and giddiness: Secondary | ICD-10-CM | POA: Diagnosis not present

## 2023-04-30 ENCOUNTER — Ambulatory Visit: Payer: Self-pay | Admitting: Urology

## 2023-05-16 ENCOUNTER — Other Ambulatory Visit: Payer: Self-pay

## 2023-05-16 ENCOUNTER — Encounter (HOSPITAL_BASED_OUTPATIENT_CLINIC_OR_DEPARTMENT_OTHER): Payer: Self-pay

## 2023-05-16 ENCOUNTER — Emergency Department (HOSPITAL_BASED_OUTPATIENT_CLINIC_OR_DEPARTMENT_OTHER)
Admission: EM | Admit: 2023-05-16 | Discharge: 2023-05-16 | Disposition: A | Discharge status: Home / Self Care | Payer: Self-pay | Attending: Emergency Medicine | Admitting: Emergency Medicine

## 2023-05-16 ENCOUNTER — Emergency Department (HOSPITAL_BASED_OUTPATIENT_CLINIC_OR_DEPARTMENT_OTHER): Payer: Self-pay | Admitting: Radiology

## 2023-05-16 DIAGNOSIS — Z20822 Contact with and (suspected) exposure to covid-19: Secondary | ICD-10-CM | POA: Insufficient documentation

## 2023-05-16 DIAGNOSIS — B349 Viral infection, unspecified: Secondary | ICD-10-CM | POA: Insufficient documentation

## 2023-05-16 LAB — RESP PANEL BY RT-PCR (RSV, FLU A&B, COVID)  RVPGX2
Influenza A by PCR: NEGATIVE
Influenza B by PCR: NEGATIVE
Resp Syncytial Virus by PCR: NEGATIVE
SARS Coronavirus 2 by RT PCR: NEGATIVE

## 2023-05-16 MED ORDER — ACETAMINOPHEN 500 MG PO TABS: 500.0000 mg | ORAL_TABLET | Freq: Four times a day (QID) | ORAL | 0 refills | Status: AC | PRN

## 2023-05-16 MED ORDER — ALBUTEROL SULFATE HFA 108 (90 BASE) MCG/ACT IN AERS
2.0000 | INHALATION_SPRAY | RESPIRATORY_TRACT | Status: DC | PRN
Start: 2023-05-16 — End: 2023-05-16
  Administered 2023-05-16: 2 via RESPIRATORY_TRACT
  Filled 2023-05-16: qty 6.7

## 2023-05-16 MED ORDER — BENZONATATE 100 MG PO CAPS: 100.0000 mg | ORAL_CAPSULE | Freq: Three times a day (TID) | ORAL | 0 refills | Status: DC

## 2023-05-16 NOTE — ED Triage Notes (Signed)
Arrives via POV with complaints of nasal congestion and respiratory symptoms x3 days. Now having increased shortness of breath as well.

## 2023-05-16 NOTE — Discharge Instructions (Addendum)
You have symptoms likely due to a viral illness.  Please take medication prescribed, stay hydrated by drinking plenty of fluid, rest, and please note your symptom may last for 1 to 2 weeks.  However, if you develop increased shortness of breath, pain in your chest with deep breathing, or coughing up blood please return promptly for reassessment and to make sure your symptom is not due to a blood clot.

## 2023-05-16 NOTE — ED Provider Notes (Signed)
[place] EMERGENCY DEPARTMENT AT [facility] Provider Note   CSN: [id number] Arrival date & time: 05/16/23  1601     History  Chief Complaint  Patient presents with   Fever   Headache    Kurt Russell is a 48 y.o. male.  The history is provided by the patient and medical records. No language interpreter was used.  Fever Associated symptoms: headaches   Headache Associated symptoms: fever      48 year old male history of hyperlipidemia, anemia, GERD, presenting with flulike symptoms.  Patient report for the past 4 days he has had sinus congestion, sneezing, coughing, throat irritation, headache, subjective fever, chills, and decrease in appetite.  He has tried using several over-the-counter medication that usually helps in the past but it has not helped with the symptoms.  He does not endorse any nausea vomiting diarrhea.  He does notice some increased shortness of breath today prompting this ER visit.  No history of asthma or COPD.  No prior history of PE or DVT.  Home Medications Prior to Admission medications   Medication Sig Start Date End Date Taking? Authorizing Provider  Cholecalciferol (VITAMIN D) 50 MCG (2000 UT) CAPS 1 capsule    [provider]  ibuprofen (ADVIL) 800 MG tablet Take 800 mg by mouth every 8 (eight) hours as needed. 09/04/21   [provider]  Multiple Vitamin (MULTIVITAMIN ADULT PO) Multivitamin w/ Iron & Minerals    [provider]  tamsulosin (FLOMAX) 0.4 MG CAPS capsule Take 1 capsule (0.4 mg total) by mouth daily. 09/24/21   Vanna Scotland, MD      Allergies    Patient has no known allergies.    Review of Systems   Review of Systems  Constitutional:  Positive for fever.  Neurological:  Positive for headaches.  All other systems reviewed and are negative.   Physical Exam Updated Vital Signs BP (!) 163/104 (BP Location: Right Arm)   Pulse 70   Temp 98.1 F (36.7 C) (Temporal)   Resp 20   Ht 6' (1.829 m)    Wt 96.6 kg   SpO2 97%   BMI 28.89 kg/m  Physical Exam Vitals and nursing note reviewed.  Constitutional:      General: He is not in acute distress.    Appearance: He is well-developed.  HENT:     Head: Atraumatic.     Mouth/Throat:     Mouth: Mucous membranes are moist.  Eyes:     Conjunctiva/sclera: Conjunctivae normal.  Neck:     Comments: No nuchal rigidity Cardiovascular:     Rate and Rhythm: Normal rate and regular rhythm.     Pulses: Normal pulses.     Heart sounds: Normal heart sounds.  Pulmonary:     Effort: Pulmonary effort is normal.     Breath sounds: Normal breath sounds. No wheezing, rhonchi or rales.  Abdominal:     Palpations: Abdomen is soft.  Musculoskeletal:        General: Normal range of motion.     Cervical back: Normal range of motion and neck supple. No rigidity.  Skin:    Capillary Refill: Capillary refill takes less than 2 seconds.     Findings: No rash.  Neurological:     Mental Status: He is alert. Mental status is at baseline.     ED Results / Procedures / Treatments   Labs (all labs ordered are listed, but only abnormal results are displayed) Labs Reviewed  RESP PANEL BY RT-PCR (  RSV, FLU A&B, COVID)  [id number]    EKG None ED ECG REPORT   Date: 05/16/2023  Rate: 68  Rhythm: normal sinus rhythm  QRS Axis: normal  Intervals: normal  ST/T Wave abnormalities: nonspecific T wave changes  Conduction Disutrbances:none  Narrative Interpretation:   Old EKG Reviewed: unchanged  I have personally reviewed the EKG tracing and agree with the computerized printout as noted.   Radiology DG Chest 2 View Result Date: 05/16/2023 CLINICAL DATA:  Cough for 3 days with shortness of breath EXAM: CHEST - 2 VIEW COMPARISON:  05/11/2010 FINDINGS: Midline trachea. Normal heart size and mediastinal contours. No pleural effusion or pneumothorax. EKG lead artifacts project over the upper lungs on the frontal radiograph. Clear lungs. Minimal biapical  pleural thickening. IMPRESSION: No acute cardiopulmonary disease. Electronically Signed   By: Jeronimo Greaves M.D.   On: 05/16/2023 17:30    Procedures Procedures    Medications Ordered in ED Medications  albuterol (VENTOLIN HFA) 108 (90 Base) MCG/ACT inhaler 2 puff (2 puffs Inhalation Given 05/16/23 1700)    ED Course/ Medical Decision Making/ A&P                                 Medical Decision Making Amount and/or Complexity of Data Reviewed Radiology: ordered.  Risk Prescription drug management.   BP (!) 163/104 (BP Location: Right Arm)   Pulse 70   Temp 98.1 F (36.7 C) (Temporal)   Resp 20   Ht 6' (1.829 m)   Wt 96.6 kg   SpO2 97%   BMI 28.89 kg/m   62:32 PM  47 year old male history of hyperlipidemia, anemia, GERD, presenting with flulike symptoms.  Patient report for the past 4 days he has had sinus congestion, sneezing, coughing, throat irritation, headache, subjective fever, chills, and decrease in appetite.  He has tried using several over-the-counter medication that usually helps in the past but it has not helped with the symptoms.  He does not endorse any nausea vomiting diarrhea.  He does notice some increased shortness of breath today prompting this ER visit.  No history of asthma or COPD.  No prior history of PE or DVT.  On exam this is a well-appearing male resting comfortably in bed appears to be in no acute discomfort.  He does not exhibit any nuchal rigidity concerning for meningitis.  Strength is equal throughout.  Lungs are clear to auscultation no wheezes rales or rhonchi heard.  Heart with normal rate and rhythm  Vitals are notable for elevated blood pressure of 163/104.  Patient is afebrile no hypoxia.  -Labs ordered, independently viewed and interpreted by me.  Labs remarkable for negative covid, flu, or rsv -The patient was maintained on a cardiac monitor.  I personally viewed and interpreted the cardiac monitored which showed an underlying rhythm of:  nsr -Imaging independently viewed and interpreted by me and I agree with radiologist's interpretation.  Result remarkable for cxr without acute changes -This patient presents to the ED for concern of flu like sxs, this involves an extensive number of treatment options, and is a complaint that carries with it a high risk of complications and morbidity.  The differential diagnosis includes covid, flu, rsv, viral illness, pneumonia -Co morbidities that complicate the patient evaluation includes hld, gerd, anemia -Treatment includes albuterol -Reevaluation of the patient after these medicines showed that the patient improved -PCP office notes or outside notes reviewed -Escalation to admission/observation considered:  patients feels much better, is comfortable with discharge, and will follow up with PCP -Prescription medication considered, patient comfortable with tylenol, tessalon -Social Determinant of Health considered   Although patient tested negative for COVID flu or RSV as well as negative chest x-ray, his symptoms is more suggestive of a viral illness.  I asked whether patient has any significant risk factors for PE DVT.  He did report traveling back from [place] 10 days ago.  Despite that, he is without any leg swelling or calf tenderness and no hemoptysis or pleuritic chest pain.  I still have low suspicion for PE or DVT but I did discussed options of chest CTA versus monitoring and strict return precaution.  Patient felt comfortable with strict return precaution.  Will provide supportive care.         Final Clinical Impression(s) / ED Diagnoses Final diagnoses:  Viral illness    Rx / DC Orders ED Discharge Orders          Ordered    benzonatate (TESSALON) 100 MG capsule  Every 8 hours        05/16/23 1814    acetaminophen (TYLENOL) 500 MG tablet  Every 6 hours PRN        05/16/23 1814              Fayrene Helper, PA-C 05/16/23 1815    Anders Simmonds T, DO 05/19/23  [phone]

## 2023-06-02 ENCOUNTER — Ambulatory Visit: Payer: Self-pay | Admitting: Urology

## 2023-06-03 ENCOUNTER — Encounter: Payer: Self-pay | Admitting: Urology

## 2023-07-29 ENCOUNTER — Ambulatory Visit (INDEPENDENT_AMBULATORY_CARE_PROVIDER_SITE_OTHER): Payer: Self-pay | Admitting: Urology

## 2023-07-29 ENCOUNTER — Encounter: Payer: Self-pay | Admitting: Urology

## 2023-07-29 VITALS — BP 148/91 | HR 79 | Ht 72.0 in | Wt 213.0 lb

## 2023-07-29 DIAGNOSIS — N528 Other male erectile dysfunction: Secondary | ICD-10-CM

## 2023-07-29 DIAGNOSIS — N410 Acute prostatitis: Secondary | ICD-10-CM

## 2023-07-29 DIAGNOSIS — Z8719 Personal history of other diseases of the digestive system: Secondary | ICD-10-CM

## 2023-07-29 DIAGNOSIS — Z87438 Personal history of other diseases of male genital organs: Secondary | ICD-10-CM

## 2023-07-29 DIAGNOSIS — K648 Other hemorrhoids: Secondary | ICD-10-CM

## 2023-07-29 DIAGNOSIS — R300 Dysuria: Secondary | ICD-10-CM

## 2023-07-29 LAB — URINALYSIS, COMPLETE
Bilirubin, UA: NEGATIVE
Glucose, UA: NEGATIVE
Ketones, UA: NEGATIVE
Leukocytes,UA: NEGATIVE
Nitrite, UA: NEGATIVE
Protein,UA: NEGATIVE
RBC, UA: NEGATIVE
Specific Gravity, UA: 1.02 (ref 1.005–1.030)
Urobilinogen, Ur: 0.2 mg/dL (ref 0.2–1.0)
pH, UA: 6.5 (ref 5.0–7.5)

## 2023-07-29 LAB — MICROSCOPIC EXAMINATION

## 2023-07-29 LAB — BLADDER SCAN AMB NON-IMAGING: Scan Result: 0

## 2023-07-29 MED ORDER — TAMSULOSIN HCL 0.4 MG PO CAPS
0.4000 mg | ORAL_CAPSULE | Freq: Every day | ORAL | 11 refills | Status: AC
Start: 2023-07-29 — End: ?

## 2023-07-29 NOTE — Progress Notes (Signed)
 Marcelle Overlie Plume,acting as a scribe for Vanna Scotland, MD.,have documented all relevant documentation on the behalf of Vanna Scotland, MD,as directed by  Vanna Scotland, MD while in the presence of Vanna Scotland, MD.  07/29/23 1:43 PM   Reyden Rought 01-25-1976 [id number]  Referring provider: Daisy Floro, MD [address] [place],  [place] [address]  Chief Complaint  Patient presents with   Dysuria    HPI:  48 year old male with a personal history of prostatitis presents today for follow-up. He initially presented with symptoms suggestive of irritated prostatitis and underwent a cystoscopy a few years ago, which was unremarkable except for a slightly elevated bladder neck. He was treated with Flomax and NSAIDS.  Recently, he developed recurrent symptoms, including frequent urination and groin pain, which worsened during a trip to [place]. The symptoms have since resolved after starting a new medication containing saw palmetto. He reports waking up once or twice at night to urinate, which is an improvement.   He also experiences erectile dysfunction, for which he has been prescribed Viagra, but it has not been effective. He inquires about the potential connection between his diet, hemorrhoids, and his symptoms.   His urinalysis today is negative.   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  Results for orders placed or performed in visit on 07/29/23  Microscopic Examination   Urine  Result Value Ref Range   WBC, UA 0-5 0 - 5 /hpf   RBC, Urine 0-2 0 - 2 /hpf   Epithelial Cells (non renal) 0-10 0 - 10 /hpf   Mucus, UA Present (A) Not Estab.   Bacteria, UA Few None seen/Few  Urinalysis, Complete  Result Value Ref Range   Specific Gravity, UA 1.020 1.005 - 1.030   pH, UA 6.5 5.0 - 7.5   Color, UA Yellow Yellow   Appearance Ur Clear Clear   Leukocytes,UA Negative Negative    Protein,UA Negative Negative/Trace   Glucose, UA Negative Negative   Ketones, UA Negative Negative   RBC, UA Negative Negative   Bilirubin, UA Negative Negative   Urobilinogen, Ur 0.2 0.2 - 1.0 mg/dL   Nitrite, UA Negative Negative   Microscopic Examination See below:   Bladder Scan (Post Void Residual) in office  Result Value Ref Range   Scan Result 0     IPSS     Row Name 07/29/23 0900         International Prostate Symptom Score   How often have you had the sensation of not emptying your bladder? Less than half the time     How often have you had to urinate less than every two hours? Less than half the time     How often have you found you stopped and started again several times when you urinated? Less than half the time     How often have you found it difficult to postpone urination? Less than half the time     How often have you had a weak urinary stream? Less than 1 in 5 times     How often have you had to strain to start urination? Less than 1 in 5 times     How many times did you typically get up at night to urinate? 1 Time     Total IPSS Score 11       Quality of Life due to urinary symptoms   If you  were to spend the rest of your life with your urinary condition just the way it is now how would you feel about that? Mostly Satisfied              Score:  1-7 Mild 8-19 Moderate 20-35 Severe  PMH: Past Medical History:  Diagnosis Date   Anemia    GERD (gastroesophageal reflux disease)    Hyperlipidemia     Surgical History: Past Surgical History:  Procedure Laterality Date   COLONOSCOPY     VASECTOMY      Home Medications:  Allergies as of 07/29/2023   No Known Allergies      Medication List        Accurate as of July 29, 2023  1:43 PM. If you have any questions, ask your nurse or doctor.          STOP taking these medications    benzonatate 100 MG capsule Commonly known as: TESSALON Stopped by: Vanna Scotland       TAKE these  medications    acetaminophen 500 MG tablet Commonly known as: TYLENOL Take 1 tablet (500 mg total) by mouth every 6 (six) hours as needed.   ibuprofen 800 MG tablet Commonly known as: ADVIL Take 800 mg by mouth every 8 (eight) hours as needed.   MULTIVITAMIN ADULT PO Multivitamin w/ Iron & Minerals   tamsulosin 0.4 MG Caps capsule Commonly known as: FLOMAX Take 1 capsule (0.4 mg total) by mouth daily.   Vitamin D 50 MCG (2000 UT) Caps 1 capsule         Social History:  reports that he has never smoked. He has never used smokeless tobacco. He reports that he does not drink alcohol and does not use drugs.   Physical Exam: BP (!) 148/91   Pulse 79   Ht 6' (1.829 m)   Wt 213 lb (96.6 kg)   BMI 28.89 kg/m   Constitutional:  Alert and oriented, No acute distress. HEENT: [place] AT, moist mucus membranes.  Trachea midline, no masses. Neurologic: Grossly intact, no focal deficits, moving all 4 extremities. Psychiatric: Normal mood and affect.   Assessment & Plan:    1. Prostatitis - His symptoms have resolved, and his PVR is zero.  - Renal ultrasound is negative.  - He has been using saw palmetto, which may have contributed to symptom improvement.  - Flomax will be refilled with additional refills for future use.  - He is advised to use anti-inflammatory medications such as ibuprofen at the onset of symptoms to manage inflammation.  2. Erectile Dysfunction - He reports erectile dysfunction, with previous Viagra use being ineffective.  - Discussed the potential need for a higher dose of Viagra (up to 100 mg) and the importance of taking it on an empty stomach. Non-pharmacological options such as a penis ring were also discussed.  - He is advised to consider these options and to ensure proper mental and physical stimulation.  3. Hemorrhoids - He has a history of hemorrhoids, which he manages with dietary changes, particularly reducing meat intake to prevent  constipation.   Return in 6 months (on 01/28/2024), or sooner if symptoms worsen or fail to improve.   [facility] [address] [place], [place] [address] [phone]

## 2023-07-30 ENCOUNTER — Encounter: Payer: Self-pay | Admitting: Urology

## 2023-08-20 ENCOUNTER — Emergency Department (HOSPITAL_BASED_OUTPATIENT_CLINIC_OR_DEPARTMENT_OTHER): Payer: Self-pay

## 2023-08-20 ENCOUNTER — Emergency Department (HOSPITAL_BASED_OUTPATIENT_CLINIC_OR_DEPARTMENT_OTHER)
Admission: EM | Admit: 2023-08-20 | Discharge: 2023-08-20 | Disposition: A | Discharge status: Home / Self Care | Payer: Self-pay | Attending: Emergency Medicine | Admitting: Emergency Medicine

## 2023-08-20 ENCOUNTER — Other Ambulatory Visit: Payer: Self-pay

## 2023-08-20 ENCOUNTER — Encounter (HOSPITAL_BASED_OUTPATIENT_CLINIC_OR_DEPARTMENT_OTHER): Payer: Self-pay | Admitting: Emergency Medicine

## 2023-08-20 DIAGNOSIS — Z79899 Other long term (current) drug therapy: Secondary | ICD-10-CM | POA: Insufficient documentation

## 2023-08-20 DIAGNOSIS — I159 Secondary hypertension, unspecified: Secondary | ICD-10-CM | POA: Insufficient documentation

## 2023-08-20 LAB — CBC WITH DIFFERENTIAL/PLATELET
Abs Immature Granulocytes: 0.01 10*3/uL (ref 0.00–0.07)
Basophils Absolute: 0 10*3/uL (ref 0.0–0.1)
Basophils Relative: 1 %
Eosinophils Absolute: 0 10*3/uL (ref 0.0–0.5)
Eosinophils Relative: 1 %
HCT: 47.4 % (ref 39.0–52.0)
Hemoglobin: 15.1 g/dL (ref 13.0–17.0)
Immature Granulocytes: 0 %
Lymphocytes Relative: 32 %
Lymphs Abs: 1.3 10*3/uL (ref 0.7–4.0)
MCH: 23.7 pg — ABNORMAL LOW (ref 26.0–34.0)
MCHC: 31.9 g/dL (ref 30.0–36.0)
MCV: 74.5 fL — ABNORMAL LOW (ref 80.0–100.0)
Monocytes Absolute: 0.4 10*3/uL (ref 0.1–1.0)
Monocytes Relative: 9 %
Neutro Abs: 2.3 10*3/uL (ref 1.7–7.7)
Neutrophils Relative %: 57 %
Platelets: 159 10*3/uL (ref 150–400)
RBC: 6.36 MIL/uL — ABNORMAL HIGH (ref 4.22–5.81)
RDW: 14.9 % (ref 11.5–15.5)
WBC: 4.1 10*3/uL (ref 4.0–10.5)
nRBC: 0 % (ref 0.0–0.2)

## 2023-08-20 LAB — BASIC METABOLIC PANEL WITH GFR
Anion gap: 10 (ref 5–15)
BUN: 12 mg/dL (ref 6–20)
CO2: 27 mmol/L (ref 22–32)
Calcium: 9.7 mg/dL (ref 8.9–10.3)
Chloride: 104 mmol/L (ref 98–111)
Creatinine, Ser: 1.06 mg/dL (ref 0.61–1.24)
GFR, Estimated: >60 mL/min (ref >60)
Glucose, Bld: 89 mg/dL (ref 70–99)
Potassium: 4.2 mmol/L (ref 3.5–5.1)
Sodium: 140 mmol/L (ref 135–145)

## 2023-08-20 LAB — TROPONIN T, HIGH SENSITIVITY: Troponin T High Sensitivity: <15 ng/L (ref <19)

## 2023-08-20 MED ORDER — AMLODIPINE BESYLATE 5 MG PO TABS
5.0000 mg | ORAL_TABLET | Freq: Every day | ORAL | 1 refills | Status: AC
Start: 2023-08-20 — End: ?

## 2023-08-20 NOTE — ED Notes (Signed)

## 2023-08-20 NOTE — ED Triage Notes (Signed)
 Hypertension today , had it previously this year yet no meds . Reports head pressure . Left upper chest pain and near syncope .

## 2023-08-20 NOTE — ED Provider Notes (Signed)
 [place] [facility] DEPARTMENT AT [facility] Provider Note   CSN: [id number] Arrival date & time: 08/20/23  1020     History  Chief Complaint  Patient presents with   Hypertension    Kurt Russell is a 48 y.o. male.  Patient here with high blood pressure.  No history of the same.  But he has noticed high blood pressure in the past he does not take medication.  He has maybe a little bit of a headache.  Some chest discomfort at times.  Nothing makes it worse or better.  He is not having any active symptoms now.  No weakness numbness tingling.  No shortness of breath.  No fever.  The history is provided by the patient.       Home Medications Prior to Admission medications   Medication Sig Start Date End Date Taking? Authorizing Provider  amLODipine (NORVASC) 5 MG tablet Take 1 tablet (5 mg total) by mouth daily. 08/20/23  Yes Emmaly Leech, DO  acetaminophen  (TYLENOL ) 500 MG tablet Take 1 tablet (500 mg total) by mouth every 6 (six) hours as needed. 05/16/23   Debbra Fairy, PA-C  Cholecalciferol (VITAMIN D) 50 MCG (2000 UT) CAPS 1 capsule    [provider]  ibuprofen (ADVIL) 800 MG tablet Take 800 mg by mouth every 8 (eight) hours as needed. 09/04/21   [provider]  Multiple Vitamin (MULTIVITAMIN ADULT PO) Multivitamin w/ Iron & Minerals    [provider]  tamsulosin  (FLOMAX ) 0.4 MG CAPS capsule Take 1 capsule (0.4 mg total) by mouth daily. 07/29/23   Dustin Gimenez, MD      Allergies    Patient has no known allergies.    Review of Systems   Review of Systems  Physical Exam Updated Vital Signs BP (!) 171/113 (BP Location: Right Arm)   Pulse 81   Temp 98.7 F (37.1 C) (Oral)   Resp 14   Wt 95.7 kg   SpO2 100%   BMI 28.62 kg/m  Physical Exam Vitals and nursing note reviewed.  Constitutional:      General: He is not in acute distress.    Appearance: He is well-developed. He is not ill-appearing.  HENT:     Head: Normocephalic  and atraumatic.     Nose: Nose normal.     Mouth/Throat:     Mouth: Mucous membranes are moist.  Eyes:     Extraocular Movements: Extraocular movements intact.     Conjunctiva/sclera: Conjunctivae normal.     Pupils: Pupils are equal, round, and reactive to light.  Cardiovascular:     Rate and Rhythm: Normal rate and regular rhythm.     Pulses: Normal pulses.     Heart sounds: Normal heart sounds. No murmur heard. Pulmonary:     Effort: Pulmonary effort is normal. No respiratory distress.     Breath sounds: Normal breath sounds.  Abdominal:     General: Abdomen is flat.     Palpations: Abdomen is soft.     Tenderness: There is no abdominal tenderness.  Musculoskeletal:        General: No swelling.     Cervical back: Normal range of motion and neck supple.  Skin:    General: Skin is warm and dry.     Capillary Refill: Capillary refill takes less than 2 seconds.  Neurological:     General: No focal deficit present.     Mental Status: He is alert and oriented to person, place, and time.  Cranial Nerves: No cranial nerve deficit.     Sensory: No sensory deficit.     Motor: No weakness.     Coordination: Coordination normal.     Comments: 5+ out of 5 strength throughout, normal sensation, no drift, normal finger-nose-finger, normal speech  Psychiatric:        Mood and Affect: Mood normal.     ED Results / Procedures / Treatments   Labs (all labs ordered are listed, but only abnormal results are displayed) Labs Reviewed  CBC WITH DIFFERENTIAL/PLATELET - Abnormal; Notable for the following components:      Result Value   RBC 6.36 (*)    MCV 74.5 (*)    MCH 23.7 (*)    All other components within normal limits  BASIC METABOLIC PANEL WITH GFR  TROPONIN T, HIGH SENSITIVITY    EKG None  Radiology DG Chest 2 View Result Date: 08/20/2023 CLINICAL DATA:  Chest pain. EXAM: CHEST - 2 VIEW COMPARISON:  May 16, 2023. FINDINGS: The heart size and mediastinal contours are  within normal limits. Both lungs are clear. The visualized skeletal structures are unremarkable. IMPRESSION: No active cardiopulmonary disease. Electronically Signed   By: Rosalene Colon M.D.   On: 08/20/2023 11:40   CT Head Wo Contrast Result Date: 08/20/2023 CLINICAL DATA:  Provided history: Headache, increasing frequency or severity. Additional history provided: Headaches. Dizziness. Poor sleep since returning from [place]. Elevated blood pressure. EXAM: CT HEAD WITHOUT CONTRAST TECHNIQUE: Contiguous axial images were obtained from the base of the skull through the vertex without intravenous contrast. RADIATION DOSE REDUCTION: This exam was performed according to the departmental dose-optimization program which includes automated exposure control, adjustment of the mA and/or kV according to patient size and/or use of iterative reconstruction technique. COMPARISON:  None. FINDINGS: Brain: No age-advanced or lobar predominant cerebral atrophy. There is no acute intracranial hemorrhage. No demarcated cortical infarct. No extra-axial fluid collection. No evidence of an intracranial mass. No midline shift. Vascular: No hyperdense vessel. Skull: No calvarial fracture or aggressive osseous lesion. Sinuses/Orbits: No mass or acute finding within the imaged orbits. Small polyps and/or mucous retention cysts, and mild background mucosal thickening, within the left maxillary sinus at the imaged levels. Small mucous retention cysts within the bilateral sphenoid sinuses. Minimal mucosal thickening within the bilateral ethmoid and left frontal sinuses. IMPRESSION: 1.  No evidence of an acute intracranial abnormality. 2. Paranasal sinus disease as described. Electronically Signed   By: Bascom Lily D.O.   On: 08/20/2023 11:37    Procedures Procedures    Medications Ordered in ED Medications - No data to display  ED Course/ Medical Decision Making/ A&P                                 Medical Decision  Making Amount and/or Complexity of Data Reviewed Labs: ordered. Radiology: ordered.  Risk Prescription drug management.   Kurt Russell is here with high blood pressure.  Blood pressure 171/113 but normal vitals.  No significant medical history.  He is noted some high blood pressures at times here recently.  He is neurologically intact.  He is had some mild headaches and chest discomfort but asymptomatic now.  Will check for ACS electrolyte abnormality head bleed will get CBC CMP troponin.  EKG shows sinus rhythm.  No ischemic changes.  Will get head CT and chest x-ray.  Per my review and interpretation lab works unremarkable.  No  significant leukocytosis anemia or electrolyte abnormality.  Troponin normal.  Head CT is unremarkable.  Chest x-ray with no evidence of pneumonia pneumothorax.  I reviewed interpreted labs and imaging.  Overall we will start amlodipine for his blood pressure.  I do not think there is any emergent process at this time.  Recommend follow-up with primary care doctor.  This chart was dictated using voice recognition software.  Despite best efforts to proofread,  errors can occur which can change the documentation meaning.         Final Clinical Impression(s) / ED Diagnoses Final diagnoses:  Secondary hypertension    Rx / DC Orders ED Discharge Orders          Ordered    amLODipine (NORVASC) 5 MG tablet  Daily        08/20/23 1250              Lowery Rue, DO 08/20/23 1251

## 2023-10-10 ENCOUNTER — Encounter: Payer: Self-pay | Admitting: Urology
